# Patient Record
Sex: Male | Born: 1940 | Race: Black or African American | Hispanic: No | Marital: Married | Smoking: Never smoker
Health system: Southern US, Community
[De-identification: ages and names within clinical notes are randomized; demographics above are authoritative.]

## PROBLEM LIST (undated history)

## (undated) DIAGNOSIS — I10 Essential (primary) hypertension: Secondary | ICD-10-CM

## (undated) DIAGNOSIS — F0390 Unspecified dementia without behavioral disturbance: Secondary | ICD-10-CM

## (undated) HISTORY — PX: MOUTH SURGERY: SHX715

---

## 2010-10-10 ENCOUNTER — Emergency Department: Payer: Self-pay | Admitting: Emergency Medicine

## 2012-07-11 ENCOUNTER — Emergency Department: Payer: Self-pay | Admitting: Emergency Medicine

## 2012-07-11 LAB — URINALYSIS, COMPLETE
Bilirubin,UR: NEGATIVE
Blood: NEGATIVE
Glucose,UR: NEGATIVE mg/dL (ref 0–75)
Nitrite: NEGATIVE
Ph: 5 (ref 4.5–8.0)
Protein: 30
RBC,UR: 1 /HPF (ref 0–5)
Specific Gravity: 1.026 (ref 1.003–1.030)
Squamous Epithelial: <1
WBC UR: 2 /HPF (ref 0–5)

## 2012-07-11 LAB — COMPREHENSIVE METABOLIC PANEL
Albumin: 3.5 g/dL (ref 3.4–5.0)
Alkaline Phosphatase: 84 U/L (ref 50–136)
Anion Gap: 6 — ABNORMAL LOW (ref 7–16)
BUN: 7 mg/dL (ref 7–18)
Calcium, Total: 8.9 mg/dL (ref 8.5–10.1)
Chloride: 106 mmol/L (ref 98–107)
Creatinine: 1.25 mg/dL (ref 0.60–1.30)
EGFR (African American): >60
Potassium: 3.6 mmol/L (ref 3.5–5.1)
SGOT(AST): 20 U/L (ref 15–37)
SGPT (ALT): 19 U/L (ref 12–78)
Sodium: 141 mmol/L (ref 136–145)
Total Protein: 8.1 g/dL (ref 6.4–8.2)

## 2012-07-11 LAB — CBC
HGB: 13.9 g/dL (ref 13.0–18.0)
MCHC: 33.7 g/dL (ref 32.0–36.0)
Platelet: 256 10*3/uL (ref 150–440)
RDW: 14.3 % (ref 11.5–14.5)
WBC: 7.2 10*3/uL (ref 3.8–10.6)

## 2012-07-11 LAB — CK TOTAL AND CKMB (NOT AT ARMC): CK, Total: 68 U/L (ref 35–232)

## 2012-07-25 ENCOUNTER — Encounter (HOSPITAL_COMMUNITY): Payer: Self-pay | Admitting: Emergency Medicine

## 2012-07-25 ENCOUNTER — Emergency Department (HOSPITAL_COMMUNITY): Payer: Medicare Other

## 2012-07-25 ENCOUNTER — Observation Stay (HOSPITAL_COMMUNITY)
Admission: EM | Admit: 2012-07-25 | Discharge: 2012-07-27 | Disposition: A | Discharge status: Home / Self Care | Payer: Medicare Other | Attending: Internal Medicine | Admitting: Internal Medicine

## 2012-07-25 DIAGNOSIS — F0390 Unspecified dementia without behavioral disturbance: Secondary | ICD-10-CM

## 2012-07-25 DIAGNOSIS — E0590 Thyrotoxicosis, unspecified without thyrotoxic crisis or storm: Secondary | ICD-10-CM

## 2012-07-25 DIAGNOSIS — I10 Essential (primary) hypertension: Secondary | ICD-10-CM

## 2012-07-25 DIAGNOSIS — R259 Unspecified abnormal involuntary movements: Secondary | ICD-10-CM | POA: Insufficient documentation

## 2012-07-25 DIAGNOSIS — R55 Syncope and collapse: Principal | ICD-10-CM

## 2012-07-25 DIAGNOSIS — Z79899 Other long term (current) drug therapy: Secondary | ICD-10-CM | POA: Insufficient documentation

## 2012-07-25 HISTORY — DX: Unspecified dementia, unspecified severity, without behavioral disturbance, psychotic disturbance, mood disturbance, and anxiety: F03.90

## 2012-07-25 HISTORY — DX: Essential (primary) hypertension: I10

## 2012-07-25 LAB — URINALYSIS, ROUTINE W REFLEX MICROSCOPIC
Glucose, UA: NEGATIVE mg/dL
Ketones, ur: 15 mg/dL — AB
Protein, ur: NEGATIVE mg/dL
pH: 6 (ref 5.0–8.0)

## 2012-07-25 LAB — COMPREHENSIVE METABOLIC PANEL
AST: 15 U/L (ref 0–37)
BUN: 10 mg/dL (ref 6–23)
CO2: 27 mEq/L (ref 19–32)
Chloride: 106 mEq/L (ref 96–112)
Creatinine, Ser: 1.21 mg/dL (ref 0.50–1.35)
GFR calc Af Amer: 68 mL/min — ABNORMAL LOW (ref >90)
GFR calc non Af Amer: 58 mL/min — ABNORMAL LOW (ref >90)
Glucose, Bld: 121 mg/dL — ABNORMAL HIGH (ref 70–99)
Total Bilirubin: 0.4 mg/dL (ref 0.3–1.2)
Total Protein: 7.5 g/dL (ref 6.0–8.3)

## 2012-07-25 LAB — CBC
HCT: 38.5 % — ABNORMAL LOW (ref 39.0–52.0)
Hemoglobin: 13.3 g/dL (ref 13.0–17.0)
MCV: 90.6 fL (ref 78.0–100.0)
Platelets: 226 10*3/uL (ref 150–400)
RBC: 4.25 MIL/uL (ref 4.22–5.81)
WBC: 6.9 10*3/uL (ref 4.0–10.5)

## 2012-07-25 LAB — URINE MICROSCOPIC-ADD ON

## 2012-07-25 LAB — CBC WITH DIFFERENTIAL/PLATELET
Hemoglobin: 13.2 g/dL (ref 13.0–17.0)
Lymphocytes Relative: 6 % — ABNORMAL LOW (ref 12–46)
Lymphs Abs: 0.4 10*3/uL — ABNORMAL LOW (ref 0.7–4.0)
Neutro Abs: 5.6 10*3/uL (ref 1.7–7.7)
Neutrophils Relative %: 88 % — ABNORMAL HIGH (ref 43–77)
Platelets: 235 10*3/uL (ref 150–400)
RBC: 4.22 MIL/uL (ref 4.22–5.81)
WBC: 6.3 10*3/uL (ref 4.0–10.5)

## 2012-07-25 NOTE — ED Notes (Addendum)
Daughter--SHONTELL -- [phone] Wife -[facility]  -- [phone]    Please notify of room assignment upon admission.

## 2012-07-25 NOTE — ED Notes (Signed)
Patient transported to CT 

## 2012-07-25 NOTE — H&P (Signed)
[facility] History and Physical  Joshua Farrell GNF:[id number] DOB: 04-29-1941 DOA: 07/25/2012  Referring physician: Dr. Karma Ganja. PCP: Default, Provider, MD  Specialists: None.  History obtained from patient's wife, nurse and ER physician. As patient is demented.  Chief Complaint: Loss of consciousness.  HPI: Joshua Farrell is a 72 y.o. male with history of hypertension, dementia had 2 episodes of loss of consciousness today at his daughter's house. As per patient's wife who was not at the site when the incident happened patient briefly lost consciousness when he tried to walk from the dining hall to the room. Once after the first spell he stood up and again had a second episode within a minute. He did not have any associated seizure like activity loss of function of extremities headache nausea vomiting diarrhea. Patient did not complain of any palpitations chest pain shortness of breath. In the ER patient was initially found to be orthostatic. EKG and CT head were unremarkable. Patient at this time has been admitted for further observation. Patient recently had tooth extraction 2 weeks ago for denture placement. Recently last few months patient has been having increasing tremors and parkinsonian features which patient's wife states that patient's PCP thinks could be from Risperdal.  Review of Systems: As presented in the history of present illness nothing else significant.  Past Medical History  Diagnosis Date  . Hypertension   . Dementia    Past Surgical History  Procedure Laterality Date  . Mouth surgery     Social History:  reports that he has never smoked. He does not have any smokeless tobacco history on file. He reports that he does not drink alcohol or use illicit drugs. Patient lives at home with his wife. where does patient live--home, ALF, SNF? and with whom if at home? Not sure if patient can do ADLs. Can patient participate in ADLs?  No Known Allergies  Family history :  Positive for dementia in patient's mother.  Prior to Admission medications   Medication Sig Start Date End Date Taking? Authorizing Provider  amLODipine (NORVASC) 5 MG tablet Take 5 mg by mouth daily.   Yes Historical Provider, MD  Cholecalciferol (VITAMIN D) 2000 UNITS CAPS Take 1 capsule by mouth daily.   Yes Historical Provider, MD  donepezil (ARICEPT) 10 MG tablet Take 20 mg by mouth every morning.   Yes Historical Provider, MD  ramelteon (ROZEREM) 8 MG tablet Take 16 mg by mouth at bedtime.   Yes Historical Provider, MD  risperiDONE (RISPERDAL) 2 MG tablet Take 2 mg by mouth every morning.   Yes Historical Provider, MD   Physical Exam: Filed Vitals:   07/25/12 1743 07/25/12 1917 07/25/12 1918 07/25/12 1919  BP: 117/73 118/70 127/87 142/88  Pulse: 90 73 73 80  Temp:      TempSrc:      Resp:      SpO2:         General:  Well-developed and nourished.  Eyes: Anicteric no pallor.  ENT: No discharge from ears eyes nose or mouth.  Neck: No mass felt.  Cardiovascular: S1-S2 heard.  Respiratory: No rhonchi no crepitations.  Abdomen: Soft nontender bowel sounds present.  Skin: No rash.  Musculoskeletal: Nontender no effusions. No edema.  Psychiatric: Patient is demented and oriented to his name only.  Neurologic: Moves all extremities 5 x 5.  Labs on Admission:  Basic Metabolic Panel:  Recent Labs Lab 07/25/12 1653  NA 141  K 4.1  CL 106  CO2 27  GLUCOSE  121*  BUN 10  CREATININE 1.21  CALCIUM 9.4   Liver Function Tests:  Recent Labs Lab 07/25/12 1653  AST 15  ALT 13  ALKPHOS 68  BILITOT 0.4  PROT 7.5  ALBUMIN 3.4*   No results found for this basename: LIPASE, AMYLASE,  in the last 168 hours No results found for this basename: AMMONIA,  in the last 168 hours CBC:  Recent Labs Lab 07/25/12 1451 07/25/12 1653  WBC 6.3 6.9  NEUTROABS 5.6  --   HGB 13.2 13.3  HCT 38.1* 38.5*  MCV 90.3 90.6  PLT 235 226   Cardiac Enzymes:  Recent Labs Lab  07/25/12 1654  TROPONINI <0.30    BNP (last 3 results) No results found for this basename: PROBNP,  in the last 8760 hours CBG: No results found for this basename: GLUCAP,  in the last 168 hours  Radiological Exams on Admission: Dg Chest 2 View  07/25/2012  *RADIOLOGY REPORT*  Clinical Data: Loss of consciousness  CHEST - 2 VIEW  Comparison: None.  Findings: Lungs are essentially clear.  No focal consolidation.  No pleural effusion or pneumothorax.  Cardiomediastinal silhouette is within Joshua limits.  Mild degenerative changes of the visualized thoracolumbar spine.  IMPRESSION: No evidence of acute cardiopulmonary disease.   Original Report Authenticated By: Charline Bills, M.D.    Ct Head Wo Contrast  07/25/2012  *RADIOLOGY REPORT*  Clinical Data: Syncope, slurred speech  CT HEAD WITHOUT CONTRAST  Technique:  Contiguous axial images were obtained from the base of the skull through the vertex without contrast.  Comparison: None.  Findings: No acute intracranial hemorrhage.  No focal mass lesion. No CT evidence of acute infarction.   No midline shift or mass effect.  No hydrocephalus.  Basilar cisterns are patent.  There is generalized cortical atrophy.  There are periventricular white matter hypodensities.  There is mild ventricular dilatation.  Paranasal sinuses and mastoid air cells are clear.  Orbits are Joshua.  IMPRESSION:  1.  No acute intracranial findings. 2.  Atrophy and concordant mild ventricular dilatation 3.  Mild microvascular disease   Original Report Authenticated By: Genevive Bi, M.D.     EKG: Independently reviewed. Sinus rhythm.  Assessment/Plan Principal Problem:   Syncope Active Problems:   Hypertension   Dementia   1. Syncope - given patient's orthostatic changes initially at this time patient has been placed on gentle hydration and recheck orthostatics in a.m. and possible cause could be mild dehydration. Presently patient is asymptomatic on  lying. 2. Hypertension - continue present medications. 3. Dementia - continue present medications. 4.   Tremor - possibly medication induced. PCP is working on this as per patient's family.   Code Status: Full code as confirmed the patient's wife. Family Communication: Discussed with patient's wife.  Disposition Plan: Admit for observation.   KAKRAKANDY,ARSHAD N. [facility] Pager [phone].  If 7PM-7AM, please contact night-coverage [email or web address] Password [facility] 07/25/2012, 9:31 PM

## 2012-07-25 NOTE — ED Provider Notes (Addendum)
History     CSN: [id number]  Arrival date & time 07/25/12  1438   First MD Initiated Contact with Patient 07/25/12 1650      Chief Complaint  Patient presents with  . Loss of Consciousness    (Consider location/radiation/quality/duration/timing/severity/associated sxs/prior treatment) HPI A LEVEL 5 CAVEAT PERTAINS DUE TO DEMENTIA Pt presents with c/o 2 syncopal episodes today.  Per family he was walking in the house and passed out briefly when walking into the kitchen and then a second time when walking into the bathroom.  Pt denies feeling any symptoms associated with the syncope- no chest pain or headache.  Family notes he has had a tremor of his upper extremities for months and this is unchanged.  Pt denies any pain from falls today.    Past Medical History  Diagnosis Date  . Hypertension   . Dementia     Past Surgical History  Procedure Laterality Date  . Mouth surgery      History reviewed. No pertinent family history.  History  Substance Use Topics  . Smoking status: Never Smoker   . Smokeless tobacco: Not on file  . Alcohol Use: No      Review of Systems UNABLE TO OBTAIN ROS DUE TO LEVEL 5 CAVEAT  Allergies  Review of patient's allergies indicates no known allergies.  Home Medications   Current Outpatient Rx  Name  Route  Sig  Dispense  Refill  . amLODipine (NORVASC) 5 MG tablet   Oral   Take 5 mg by mouth daily.         . Cholecalciferol (VITAMIN D) 2000 UNITS CAPS   Oral   Take 1 capsule by mouth daily.         Marland Kitchen donepezil (ARICEPT) 10 MG tablet   Oral   Take 20 mg by mouth every morning.         . ramelteon (ROZEREM) 8 MG tablet   Oral   Take 16 mg by mouth at bedtime.         . risperiDONE (RISPERDAL) 2 MG tablet   Oral   Take 2 mg by mouth every morning.           BP 142/88  Pulse 80  Temp(Src) 98.3 F (36.8 C) (Oral)  Resp 16  SpO2 100% Vitals reviewed Physical Exam Physical Examination: General appearance - alert,  well appearing, and in no distress Mental status - alert, oriented to person, and place, not to time Eyes - pupils equal and reactive, extraocular eye movements intact Head- NCAT Mouth - mucous membranes moist, pharynx normal without lesions Chest - clear to auscultation, no wheezes, rales or rhonchi, symmetric air entry Heart - normal rate, regular rhythm, normal S1, S2, no murmurs, rubs, clicks or gallops Abdomen - soft, nontender, nondistended, no masses or organomegaly Back- nontender in midline spine, no CVA tenderness Neurological - alert, oriented, normal speech, no focal findings or movement disorder noted Musculoskeletal - FROM of extremities x 4 without pain, no joint tenderness, deformity or swelling Extremities - peripheral pulses normal, no pedal edema, no clubbing or cyanosis Skin - normal coloration and turgor, no rashes  ED Course  Procedures (including critical care time)  8:23 PM d/w [facility]- pt to be admitted telemetry, team 10, Dr. Toniann Fail   Date: 07/25/2012  Rate: 83  Rhythm: normal sinus rhythm  QRS Axis: left  Intervals: normal  ST/T Wave abnormalities: normal  Conduction Disutrbances:none  Narrative Interpretation:   Old EKG Reviewed: none  available   Labs Reviewed  CBC WITH DIFFERENTIAL - Abnormal; Notable for the following:    HCT 38.1 (*)    Neutrophils Relative 88 (*)    Lymphocytes Relative 6 (*)    Lymphs Abs 0.4 (*)    All other components within normal limits  CBC - Abnormal; Notable for the following:    HCT 38.5 (*)    All other components within normal limits  COMPREHENSIVE METABOLIC PANEL - Abnormal; Notable for the following:    Glucose, Bld 121 (*)    Albumin 3.4 (*)    GFR calc non Af Amer 58 (*)    GFR calc Af Amer 68 (*)    All other components within normal limits  URINALYSIS, ROUTINE W REFLEX MICROSCOPIC - Abnormal; Notable for the following:    Color, Urine AMBER (*)    APPearance CLOUDY (*)    Bilirubin Urine  SMALL (*)    Ketones, ur 15 (*)    Leukocytes, UA SMALL (*)    All other components within normal limits  URINE MICROSCOPIC-ADD ON - Abnormal; Notable for the following:    Casts HYALINE CASTS (*)    Crystals CA OXALATE CRYSTALS (*)    All other components within normal limits  URINE CULTURE  TROPONIN I   Dg Chest 2 View  07/25/2012  *RADIOLOGY REPORT*  Clinical Data: Loss of consciousness  CHEST - 2 VIEW  Comparison: None.  Findings: Lungs are essentially clear.  No focal consolidation.  No pleural effusion or pneumothorax.  Cardiomediastinal silhouette is within normal limits.  Mild degenerative changes of the visualized thoracolumbar spine.  IMPRESSION: No evidence of acute cardiopulmonary disease.   Original Report Authenticated By: Charline Bills, M.D.    Ct Head Wo Contrast  07/25/2012  *RADIOLOGY REPORT*  Clinical Data: Syncope, slurred speech  CT HEAD WITHOUT CONTRAST  Technique:  Contiguous axial images were obtained from the base of the skull through the vertex without contrast.  Comparison: None.  Findings: No acute intracranial hemorrhage.  No focal mass lesion. No CT evidence of acute infarction.   No midline shift or mass effect.  No hydrocephalus.  Basilar cisterns are patent.  There is generalized cortical atrophy.  There are periventricular white matter hypodensities.  There is mild ventricular dilatation.  Paranasal sinuses and mastoid air cells are clear.  Orbits are normal.  IMPRESSION:  1.  No acute intracranial findings. 2.  Atrophy and concordant mild ventricular dilatation 3.  Mild microvascular disease   Original Report Authenticated By: Genevive Bi, M.D.      1. Syncope   2. Dementia   3. Hypertension       MDM  Pt presenting with 2 syncopal episodes today.  ekg unremarkable as well as hemoglobin and other labs are reassuring.  Pt has no evidence of trauma on exam.  D/w [facility] for admission for syncope evaluation.  Discharged with strict return precautions.  Pt  agreeable with plan.        Ethelda Chick, MD 07/25/12 2245  Ethelda Chick, MD 07/25/12 [phone]

## 2012-07-25 NOTE — ED Notes (Signed)
Family reports pt passed out twice today first time at 76 and 1343. Pt has been shaking for months. Family reports slurred speech also.

## 2012-07-25 NOTE — ED Notes (Signed)
Report received from Chris, RN

## 2012-07-25 NOTE — ED Notes (Signed)
Family at bedside. 

## 2012-07-26 LAB — BASIC METABOLIC PANEL
BUN: 7 mg/dL (ref 6–23)
Calcium: 9.2 mg/dL (ref 8.4–10.5)
GFR calc non Af Amer: 81 mL/min — ABNORMAL LOW (ref >90)
Glucose, Bld: 86 mg/dL (ref 70–99)
Sodium: 143 mEq/L (ref 135–145)

## 2012-07-26 LAB — CBC WITH DIFFERENTIAL/PLATELET
Basophils Absolute: 0 10*3/uL (ref 0.0–0.1)
Eosinophils Absolute: 0.2 10*3/uL (ref 0.0–0.7)
Eosinophils Relative: 3 % (ref 0–5)
MCH: 31 pg (ref 26.0–34.0)
MCV: 91.1 fL (ref 78.0–100.0)
Platelets: 205 10*3/uL (ref 150–400)
RDW: 13.9 % (ref 11.5–15.5)

## 2012-07-26 LAB — T4, FREE: Free T4: 1.03 ng/dL (ref 0.80–1.80)

## 2012-07-26 MED ORDER — ACETAMINOPHEN 650 MG RE SUPP: 650.0000 mg | Freq: Four times a day (QID) | RECTAL | Status: DC | PRN

## 2012-07-26 MED ORDER — ONDANSETRON HCL 4 MG PO TABS: 4.0000 mg | ORAL_TABLET | Freq: Four times a day (QID) | ORAL | Status: DC | PRN

## 2012-07-26 MED ORDER — SODIUM CHLORIDE 0.9 % IJ SOLN
3.0000 mL | Freq: Two times a day (BID) | INTRAMUSCULAR | Status: DC
  Administered 2012-07-26 (×2): 3 mL via INTRAVENOUS

## 2012-07-26 MED ORDER — ACETAMINOPHEN 325 MG PO TABS: 650.0000 mg | ORAL_TABLET | Freq: Four times a day (QID) | ORAL | Status: DC | PRN

## 2012-07-26 MED ORDER — ENOXAPARIN SODIUM 40 MG/0.4ML ~~LOC~~ SOLN
40.0000 mg | SUBCUTANEOUS | Status: DC
  Administered 2012-07-26: 40 mg via SUBCUTANEOUS
  Filled 2012-07-26 (×2): qty 0.4

## 2012-07-26 MED ORDER — RISPERIDONE 2 MG PO TABS
2.0000 mg | ORAL_TABLET | Freq: Every morning | ORAL | Status: DC
  Administered 2012-07-26 – 2012-07-27 (×2): 2 mg via ORAL
  Filled 2012-07-26 (×2): qty 1

## 2012-07-26 MED ORDER — AMLODIPINE BESYLATE 5 MG PO TABS
5.0000 mg | ORAL_TABLET | Freq: Every day | ORAL | Status: DC
  Administered 2012-07-26 – 2012-07-27 (×2): 5 mg via ORAL
  Filled 2012-07-26 (×2): qty 1

## 2012-07-26 MED ORDER — ONDANSETRON HCL 4 MG/2ML IJ SOLN: 4.0000 mg | Freq: Four times a day (QID) | INTRAMUSCULAR | Status: DC | PRN

## 2012-07-26 MED ORDER — SODIUM CHLORIDE 0.9 % IV SOLN
INTRAVENOUS | Status: AC
  Administered 2012-07-26 (×2): via INTRAVENOUS

## 2012-07-26 MED ORDER — RAMELTEON 8 MG PO TABS
16.0000 mg | ORAL_TABLET | Freq: Every day | ORAL | Status: DC
  Administered 2012-07-26: 16 mg via ORAL
  Filled 2012-07-26 (×3): qty 2

## 2012-07-26 MED ORDER — DONEPEZIL HCL 10 MG PO TABS
20.0000 mg | ORAL_TABLET | Freq: Every morning | ORAL | Status: DC
  Administered 2012-07-26 – 2012-07-27 (×2): 20 mg via ORAL
  Filled 2012-07-26 (×2): qty 2

## 2012-07-26 NOTE — Progress Notes (Signed)
[facility] PROGRESS NOTE  Joshua Farrell ZOX:[id number] DOB: 06-13-1940 DOA: 07/25/2012 PCP: Default, Provider, MD  Brief Narrative: Joshua Farrell is a 72 y.o. male with history of hypertension, dementia had 2 episodes of loss of consciousness today at his daughter's house. As per patient's wife who was not at the site when the incident happened patient briefly lost consciousness when he tried to walk from the dining hall to the room. Once after the first spell he stood up and again had a second episode within a minute. He did not have any associated seizure like activity loss of function of extremities headache nausea vomiting diarrhea. Patient did not complain of any palpitations chest pain shortness of breath. In the ER patient was initially found to be orthostatic. EKG and CT head were unremarkable. Patient at this time has been admitted for further observation. Patient recently had tooth extraction 2 weeks ago for denture placement. Recently last few months patient has been having increasing tremors and parkinsonian features which patient's wife states that patient's PCP thinks could be from Risperdal.  Assessment/Plan: 1. Syncope - given patient's orthostatic changes initially at this time patient has been placed on gentle hydration. Patient orthostasis has improved. 2. High TSH - hyperthyroidism vs subclinical hyperthyroidism. Heart rate normal and tremor not consistent with tremor generally associated with hyperthyroidism. Will check T3, T4 and cortisol. Will contact PCP to follow up.  3. Hypertension - continue present medications. 4. Dementia - continue present medications. 5. Tremor - possibly medication induced. PCP is working on this as per patient's family.   Code Status: Full Family Communication: wife, daughter  Disposition Plan: home tomorrow.   Consultants:  none  Procedures:  none  Antibiotics:  none  HPI/Subjective: Denies complaints today.   Objective: Filed  Vitals:   07/26/12 1250 07/26/12 1251 07/26/12 1252 07/26/12 1255  BP: 121/71 128/75 125/71 129/75  Pulse: 64 65 72 74  Temp: 97.2 F (36.2 C)     TempSrc: Oral     Resp: 18     Height:      Weight:      SpO2: 100%       Intake/Output Summary (Last 24 hours) at 07/26/12 1654 Last data filed at 07/26/12 0800  Gross per 24 hour  Intake    240 ml  Output    200 ml  Net     40 ml   Filed Weights   07/25/12 2325 07/26/12 0500  Weight: 53.661 kg (118 lb 4.8 oz) 53.661 kg (118 lb 4.8 oz)    Exam:   General:  NAD  Cardiovascular: regular rate and rhythm, without MRG  Respiratory: good air movement, clear to auscultation throughout, no wheezing, ronchi or rales  Abdomen: soft, not tender to palpation, positive bowel sounds  MSK: no peripheral edema  Neuro: non focal, mild tremor  Data Reviewed: Basic Metabolic Panel:  Recent Labs Lab 07/25/12 1653 07/26/12 0712  NA 141 143  K 4.1 3.7  CL 106 108  CO2 27 29  GLUCOSE 121* 86  BUN 10 7  CREATININE 1.21 0.97  CALCIUM 9.4 9.2   Liver Function Tests:  Recent Labs Lab 07/25/12 1653  AST 15  ALT 13  ALKPHOS 68  BILITOT 0.4  PROT 7.5  ALBUMIN 3.4*   No results found for this basename: LIPASE, AMYLASE,  in the last 168 hours No results found for this basename: AMMONIA,  in the last 168 hours CBC:  Recent Labs Lab 07/25/12 1451 07/25/12 1653 07/26/12  0712  WBC 6.3 6.9 5.0  NEUTROABS 5.6  --  3.1  HGB 13.2 13.3 12.9*  HCT 38.1* 38.5* 37.9*  MCV 90.3 90.6 91.1  PLT 235 226 205   Cardiac Enzymes:  Recent Labs Lab 07/25/12 1654  TROPONINI <0.30   BNP (last 3 results) No results found for this basename: PROBNP,  in the last 8760 hours CBG: No results found for this basename: GLUCAP,  in the last 168 hours  No results found for this or any previous visit (from the past 240 hour(s)).   Studies: Dg Chest 2 View  07/25/2012  *RADIOLOGY REPORT*  Clinical Data: Loss of consciousness  CHEST - 2 VIEW   Comparison: None.  Findings: Lungs are essentially clear.  No focal consolidation.  No pleural effusion or pneumothorax.  Cardiomediastinal silhouette is within normal limits.  Mild degenerative changes of the visualized thoracolumbar spine.  IMPRESSION: No evidence of acute cardiopulmonary disease.   Original Report Authenticated By: Charline Bills, M.D.    Ct Head Wo Contrast  07/25/2012  *RADIOLOGY REPORT*  Clinical Data: Syncope, slurred speech  CT HEAD WITHOUT CONTRAST  Technique:  Contiguous axial images were obtained from the base of the skull through the vertex without contrast.  Comparison: None.  Findings: No acute intracranial hemorrhage.  No focal mass lesion. No CT evidence of acute infarction.   No midline shift or mass effect.  No hydrocephalus.  Basilar cisterns are patent.  There is generalized cortical atrophy.  There are periventricular white matter hypodensities.  There is mild ventricular dilatation.  Paranasal sinuses and mastoid air cells are clear.  Orbits are normal.  IMPRESSION:  1.  No acute intracranial findings. 2.  Atrophy and concordant mild ventricular dilatation 3.  Mild microvascular disease   Original Report Authenticated By: Genevive Bi, M.D.     Scheduled Meds: . amLODipine  5 mg Oral Daily  . donepezil  20 mg Oral q morning - 10a  . enoxaparin (LOVENOX) injection  40 mg Subcutaneous Q24H  . ramelteon  16 mg Oral QHS  . risperiDONE  2 mg Oral q morning - 10a  . sodium chloride  3 mL Intravenous Q12H   Continuous Infusions:   Principal Problem:   Syncope Active Problems:   Hypertension   Dementia   [name], MD [facility] Pager [phone]. If 7 PM - 7 AM, please contact night-coverage at [email or web address], password [facility] 07/26/2012, 4:54 PM  LOS: 1 day

## 2012-07-26 NOTE — Evaluation (Signed)
Physical Therapy Evaluation Patient Details Name: Joshua Farrell MRN: [id number] DOB: 11/30/1940 Today's Date: 07/26/2012 Time: 1420-1440 PT Time Calculation (min): 20 min  PT Assessment / Plan / Recommendation Clinical Impression  72 yo male admitted with syncope, fall. Family present during eval. On eval pt was supervision level assist for all mobility. No LOB with tasks. Family is available 24/7. Feel pt is safe to d/c back home with family supervision. No follow-up PT needed acutely or after d/c. Recommend daily mobility with nursing/family during stay. 1x eval.     PT Assessment  Patent does not need any further PT services    Follow Up Recommendations  No PT follow up    Does the patient have the potential to tolerate intense rehabilitation      Barriers to Discharge        Equipment Recommendations  None recommended by PT    Recommendations for Other Services     Frequency      Precautions / Restrictions Precautions Precautions: None Restrictions Weight Bearing Restrictions: No   Pertinent Vitals/Pain No c/o pain      Mobility  Bed Mobility Bed Mobility: Sit to Supine Sit to Supine: 5: Supervision Transfers Transfers: Sit to Stand;Stand to Sit Sit to Stand: From bed;6: Modified independent (Device/Increase time) Stand to Sit: To bed;6: Modified independent (Device/Increase time) Ambulation/Gait Ambulation/Gait Assistance: 5: Supervision Ambulation Distance (Feet): 200 Feet Assistive device: None Ambulation/Gait Assistance Details: Good gait speed. No LOB Gait Pattern: Step-through pattern Stairs: Yes Stairs Assistance: 5: Supervision Stairs Assistance Details (indicate cue type and reason): VCS safety Stair Management Technique: Alternating pattern;No rails Number of Stairs: 4    Exercises     PT Diagnosis:    PT Problem List:   PT Treatment Interventions:     PT Goals    Visit Information  Last PT Received On: 07/26/12 Assistance Needed: +1     Subjective Data  Subjective: Im alright Patient Stated Goal: none stated   Prior Functioning  Home Living Lives With: Spouse;Family Available Help at Discharge: Available 24 hours/day Type of Home: House Home Access: Stairs to enter [employer] of Steps: 5 Entrance Stairs-Rails: None Home Layout: One level Home Adaptive Equipment: None Prior Function Level of Independence: Needs assistance Able to Take Stairs?: Yes Driving: No Comments: Pt is supervision level assist. Communication Communication: No difficulties    Cognition  Cognition Overall Cognitive Status: History of cognitive impairments - at baseline Arousal/Alertness: Awake/alert Orientation Level: Time Behavior During Session: [facility] for tasks performed    Extremity/Trunk Assessment Right Lower Extremity Assessment RLE ROM/Strength/Tone: [facility] for tasks assessed Left Lower Extremity Assessment LLE ROM/Strength/Tone: [facility] for tasks assessed Trunk Assessment Trunk Assessment: Normal   Balance Balance Balance Assessed: Yes Static Standing Balance Static Standing - Level of Assistance: 6: Modified independent (Device/Increase time) Static Standing - Comment/# of Minutes: Had pt perform EO/EC, narrow BOS - no LOB/difficulty Single Leg Stance - Right Leg: 4 (LOB after 4 seconds. Good use of step strategy) Dynamic Standing Balance Dynamic Standing - Balance Support: No upper extremity supported Dynamic Standing - Level of Assistance: 6: Modified independent (Device/Increase time) Dynamic Standing - Comments: 360 degree turn, p/u object-no LOB difficulty. Increased time to complete turn.  High Level Balance High Level Balance Activites: Direction changes;Turns  End of Session PT - End of Session Equipment Utilized During Treatment: Gait belt Activity Tolerance: Patient tolerated treatment well Patient left: in bed;with call bell/phone within reach;with family/visitor present  GP Functional Assessment Tool  Used:  clinical judgement Functional Limitation: Mobility: Walking and moving around Mobility: Walking and Moving Around Current Status [phone]): At least 1 percent but less than 20 percent impaired, limited or restricted Mobility: Walking and Moving Around Goal Status [phone]): At least 1 percent but less than 20 percent impaired, limited or restricted Mobility: Walking and Moving Around Discharge Status [phone]): At least 1 percent but less than 20 percent impaired, limited or restricted   Rebeca Alert [facility] 07/26/2012, 2:46 PM [phone]

## 2012-07-27 LAB — URINE CULTURE

## 2012-07-27 NOTE — Discharge Instructions (Signed)
Please follow up with your PCP in 1-2 weeks and take these instructions with you.  Your TSH   Lab Results  Component Value Date   TSH 0.215* 07/26/2012   was found to be mildly lower than normal which might suggest hyperthyroidism. 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 This condition is unlikely to give you any symptoms and generally does not require treatment but it needs to be followed up.   Syncope Syncope means a person passes out (faints). The person usually wakes up in less than 5 minutes. It is important to seek medical care for syncope. HOME CARE  Have someone stay with you until you feel normal.  Do not drive, use machines, or play sports until your doctor says it is okay.  Keep all doctor visits as told.  Lie down when you feel like you might pass out. Take deep breaths. Wait until you feel normal before standing up.  Drink enough fluids to keep your pee (urine) clear or pale yellow.  If you take blood pressure or heart medicine, get up slowly. Take several minutes to sit and then stand. GET HELP RIGHT AWAY IF:   You have a severe headache.  You have pain in the chest, belly (abdomen), or back.  You are bleeding from the mouth or butt (rectum).  You have black or tarry poop (stool).  You have an irregular or very fast heartbeat.  You have pain with breathing.  You keep passing out, or you have shaking (seizures) when you pass out.  You pass out when sitting or lying down.  You feel confused.  You have trouble walking.  You have severe weakness.  You have vision problems. If you fainted, call your local emergency services (911 in [place] Do not drive yourself to the hospital. MAKE SURE YOU:   Understand these instructions.  Will watch your condition.  Will get help right away if you are not doing well or get worse. Document Released: 10/29/2007 Document Revised: 11/11/2011 Document  Reviewed: 07/11/2011 [facility] Patient Information 2013 [place], [place].

## 2012-07-27 NOTE — Discharge Summary (Signed)
Physician Discharge Summary  Joshua Farrell ZOX:[id number] DOB: 1940-09-09 DOA: 07/25/2012  PCP: Default, Provider, MD  Admit date: 07/25/2012 Discharge date: 07/27/2012  Time spent: 35 minutes  Recommendations for Outpatient Follow-up:  1. Please followup with her primary care doctor in one to 2 weeks  Discharge Diagnoses:  Principal Problem:   Syncope Active Problems:   Hypertension   Dementia  Discharge Condition: Stable  Diet recommendation: Heart healthy  Filed Weights   07/25/12 2325 07/26/12 0500 07/27/12 0542  Weight: 53.661 kg (118 lb 4.8 oz) 53.661 kg (118 lb 4.8 oz) 54.7 kg (120 lb 9.5 oz)    History of present illness:  Joshua Farrell is a 72 y.o. male with history of hypertension, dementia had 2 episodes of loss of consciousness today at his daughter's house. As per patient's wife who was not at the site when the incident happened patient briefly lost consciousness when he tried to walk from the dining hall to the room. Once after the first spell he stood up and again had a second episode within a minute. He did not have any associated seizure like activity loss of function of extremities headache nausea vomiting diarrhea. Patient did not complain of any palpitations chest pain shortness of breath. In the ER patient was initially found to be orthostatic. EKG and CT head were unremarkable. Patient at this time has been admitted for further observation. Patient recently had tooth extraction 2 weeks ago for denture placement. Recently last few months patient has been having increasing tremors and parkinsonian features which patient's wife states that patient's PCP thinks could be from Risperdal.  Hospital Course:  1. Syncope - given patient's orthostatic changes initially at this time patient has been placed initially on gentle hydration. Repeat orthostatic vital signs showed improvement of the hydration, and patient was asymptomatic. 2. High TSH - patient was found to have a mildly  decreased TSH at 0.215 with normal free T3 and T4. This is likely subclinical hyperthyroidism as patient does not have any symptoms consistent with hyperthyroidism. 3. Hypertension - his home medications were continued throughout his hospital stay, and no changes were made on discharge. 4. Dementia - his home medications were continued throughout his hospital stay, and no changes were made on discharge. 5. Tremor - possibly medication induced. PCP is working this as an outpatient  Procedures:  None (i.e. Studies not automatically included, echos, thoracentesis, etc; not x-rays)  Consultations:  None  Discharge Exam: Filed Vitals:   07/26/12 1252 07/26/12 1255 07/26/12 2100 07/27/12 0542  BP: 125/71 129/75 125/71 135/74  Pulse: 72 74 59 56  Temp:   97.9 F (36.6 C) 98.5 F (36.9 C)  TempSrc:   Oral Oral  Resp:   18 18  Height:      Weight:    54.7 kg (120 lb 9.5 oz)  SpO2:   100% 100%    General: No acute distress Cardiovascular: Regular rate and rhythm, without murmurs Respiratory: Clear to auscultation bilaterally, no wheezing  Discharge Instructions    Medication List    TAKE these medications       amLODipine 5 MG tablet  Commonly known as:  NORVASC  Take 5 mg by mouth daily.     donepezil 10 MG tablet  Commonly known as:  ARICEPT  Take 20 mg by mouth every morning.     ramelteon 8 MG tablet  Commonly known as:  ROZEREM  Take 16 mg by mouth at bedtime.     risperiDONE 2 MG  tablet  Commonly known as:  RISPERDAL  Take 2 mg by mouth every morning.     Vitamin D 2000 UNITS Caps  Take 1 capsule by mouth daily.       The results of significant diagnostics from this hospitalization (including imaging, microbiology, ancillary and laboratory) are listed below for reference.    Significant Diagnostic Studies: Dg Chest 2 View  07/25/2012  *RADIOLOGY REPORT*  Clinical Data: Loss of consciousness  CHEST - 2 VIEW  Comparison: None.  Findings: Lungs are essentially  clear.  No focal consolidation.  No pleural effusion or pneumothorax.  Cardiomediastinal silhouette is within normal limits.  Mild degenerative changes of the visualized thoracolumbar spine.  IMPRESSION: No evidence of acute cardiopulmonary disease.   Original Report Authenticated By: Charline Bills, M.D.    Ct Head Wo Contrast  07/25/2012  *RADIOLOGY REPORT*  Clinical Data: Syncope, slurred speech  CT HEAD WITHOUT CONTRAST  Technique:  Contiguous axial images were obtained from the base of the skull through the vertex without contrast.  Comparison: None.  Findings: No acute intracranial hemorrhage.  No focal mass lesion. No CT evidence of acute infarction.   No midline shift or mass effect.  No hydrocephalus.  Basilar cisterns are patent.  There is generalized cortical atrophy.  There are periventricular white matter hypodensities.  There is mild ventricular dilatation.  Paranasal sinuses and mastoid air cells are clear.  Orbits are normal.  IMPRESSION:  1.  No acute intracranial findings. 2.  Atrophy and concordant mild ventricular dilatation 3.  Mild microvascular disease   Original Report Authenticated By: Genevive Bi, M.D.    Microbiology: Recent Results (from the past 240 hour(s))  URINE CULTURE     Status: None   Collection Time    07/25/12  7:00 PM      Result Value Range Status   Specimen Description URINE, CLEAN CATCH   Final   Special Requests ADDED [id number]   Final   Culture  Setup Time 07/25/2012 22:05   Final   Colony Count NO GROWTH   Final   Culture NO GROWTH   Final   Report Status 07/27/2012 FINAL   Final     Labs: Basic Metabolic Panel:  Recent Labs Lab 07/25/12 1653 07/26/12 0712  NA 141 143  K 4.1 3.7  CL 106 108  CO2 27 29  GLUCOSE 121* 86  BUN 10 7  CREATININE 1.21 0.97  CALCIUM 9.4 9.2   Liver Function Tests:  Recent Labs Lab 07/25/12 1653  AST 15  ALT 13  ALKPHOS 68  BILITOT 0.4  PROT 7.5  ALBUMIN 3.4*   CBC:  Recent Labs Lab 07/25/12 1451  07/25/12 1653 07/26/12 0712  WBC 6.3 6.9 5.0  NEUTROABS 5.6  --  3.1  HGB 13.2 13.3 12.9*  HCT 38.1* 38.5* 37.9*  MCV 90.3 90.6 91.1  PLT 235 226 205   Cardiac Enzymes:  Recent Labs Lab 07/25/12 1654  TROPONINI <0.30   Lab Results  Component Value Date   TSH 0.215* 07/26/2012   Signed:  [name]  [facility] 07/27/2012, 1:44 PM

## 2012-11-12 LAB — CBC
HCT: 39.5 % — ABNORMAL LOW (ref 40.0–52.0)
HGB: 13.3 g/dL (ref 13.0–18.0)
MCH: 30.1 pg (ref 26.0–34.0)
MCV: 90 fL (ref 80–100)
RBC: 4.42 10*6/uL (ref 4.40–5.90)
WBC: 4.4 10*3/uL (ref 3.8–10.6)

## 2012-11-12 LAB — COMPREHENSIVE METABOLIC PANEL
Albumin: 3.4 g/dL (ref 3.4–5.0)
Alkaline Phosphatase: 74 U/L (ref 50–136)
BUN: 7 mg/dL (ref 7–18)
Bilirubin,Total: 0.5 mg/dL (ref 0.2–1.0)
Calcium, Total: 8.9 mg/dL (ref 8.5–10.1)
Co2: 29 mmol/L (ref 21–32)
EGFR (African American): >60
EGFR (Non-African Amer.): >60
Glucose: 128 mg/dL — ABNORMAL HIGH (ref 65–99)
Osmolality: 283 (ref 275–301)
SGPT (ALT): 20 U/L (ref 12–78)

## 2012-11-13 ENCOUNTER — Observation Stay: Payer: Self-pay | Admitting: Internal Medicine

## 2012-11-13 LAB — CBC WITH DIFFERENTIAL/PLATELET
Eosinophil #: 0 10*3/uL (ref 0.0–0.7)
HGB: 12.9 g/dL — ABNORMAL LOW (ref 13.0–18.0)
MCHC: 33.8 g/dL (ref 32.0–36.0)
MCV: 91 fL (ref 80–100)
Monocyte #: 0.2 x10 3/mm (ref 0.2–1.0)
Monocyte %: 3.1 %
Neutrophil %: 92 %
Platelet: 210 10*3/uL (ref 150–440)
RBC: 4.19 10*6/uL — ABNORMAL LOW (ref 4.40–5.90)
WBC: 7.8 10*3/uL (ref 3.8–10.6)

## 2012-11-13 LAB — BASIC METABOLIC PANEL
Calcium, Total: 8.6 mg/dL (ref 8.5–10.1)
Chloride: 107 mmol/L (ref 98–107)
Co2: 31 mmol/L (ref 21–32)
Creatinine: 1.05 mg/dL (ref 0.60–1.30)
EGFR (African American): >60
Glucose: 134 mg/dL — ABNORMAL HIGH (ref 65–99)
Potassium: 3.8 mmol/L (ref 3.5–5.1)
Sodium: 144 mmol/L (ref 136–145)

## 2012-11-13 LAB — MAGNESIUM: Magnesium: 2 mg/dL

## 2012-11-14 LAB — CBC WITH DIFFERENTIAL/PLATELET
Basophil %: 0.4 %
Eosinophil #: 1.5 10*3/uL — ABNORMAL HIGH (ref 0.0–0.7)
Eosinophil %: 13.6 %
HGB: 11.2 g/dL — ABNORMAL LOW (ref 13.0–18.0)
MCH: 30.4 pg (ref 26.0–34.0)
MCHC: 33.8 g/dL (ref 32.0–36.0)
Monocyte %: 4.8 %
Neutrophil #: 8 10*3/uL — ABNORMAL HIGH (ref 1.4–6.5)
Platelet: 172 10*3/uL (ref 150–440)

## 2012-11-14 LAB — BASIC METABOLIC PANEL
Anion Gap: 6 — ABNORMAL LOW (ref 7–16)
BUN: 9 mg/dL (ref 7–18)
EGFR (African American): >60
EGFR (Non-African Amer.): >60
Potassium: 3.4 mmol/L — ABNORMAL LOW (ref 3.5–5.1)
Sodium: 145 mmol/L (ref 136–145)

## 2013-04-25 DEATH — deceased

## 2013-11-04 IMAGING — CT CT HEAD WITHOUT CONTRAST
1 series · 16 of 30 positions shown, 20 images · non-contrast
Comparison: none

REASON FOR EXAM: weak syncopy
COMMENTS:

PROCEDURE:     CT  - CT HEAD WITHOUT CONTRAST  - July 11, 2012  [DATE]
RESULT:     Technique: Helical noncontrasted 5 mm sections were obtained
from the skull base through the vertex.

[Series 3: bone · axial · 0.39mm/px · z∈[-112,+23]mm · 16 of 31 slices shown, 20 images]
[im 2/31  brain]
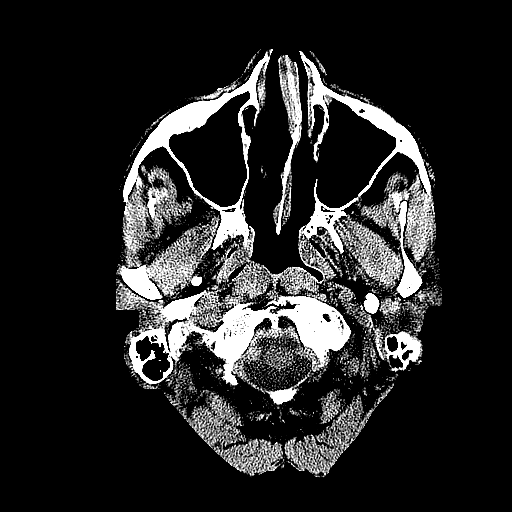
[im 2/31  bone]
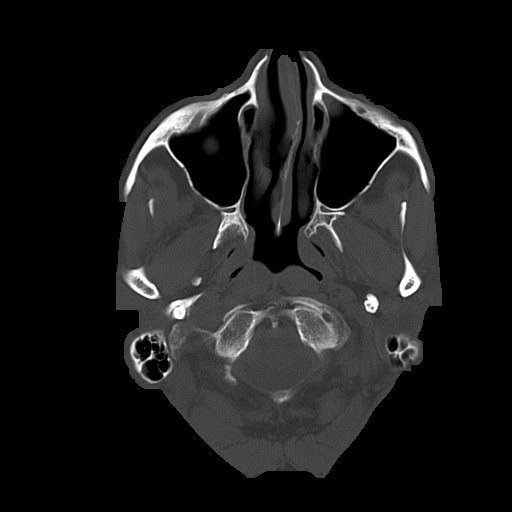
[im 4/31  brain]
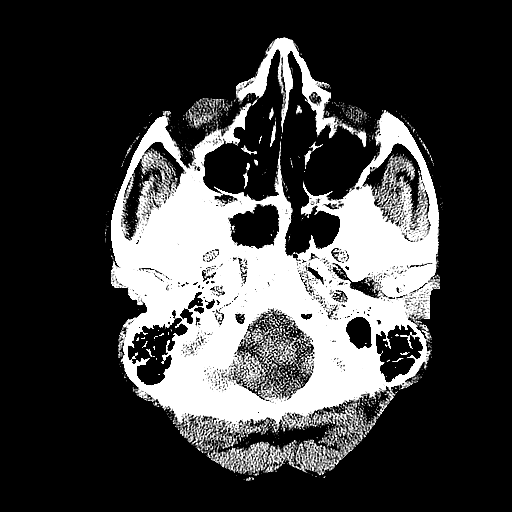
[im 6/31  brain]
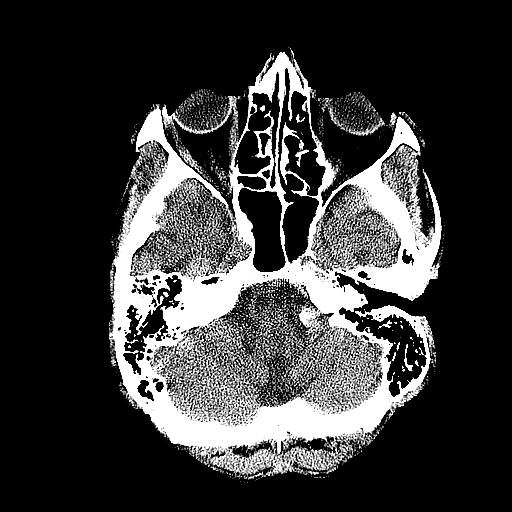
[im 8/31  brain]
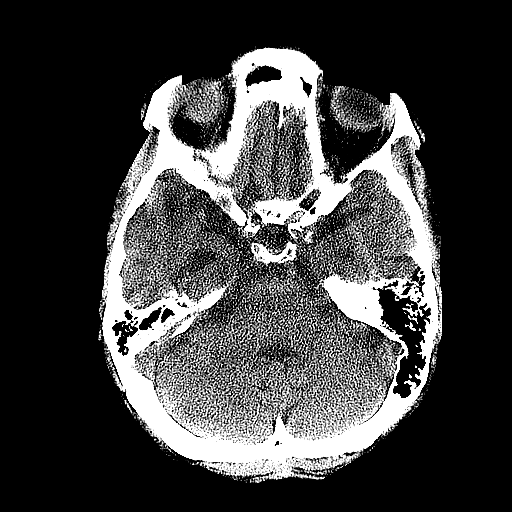
[im 9/31  brain]
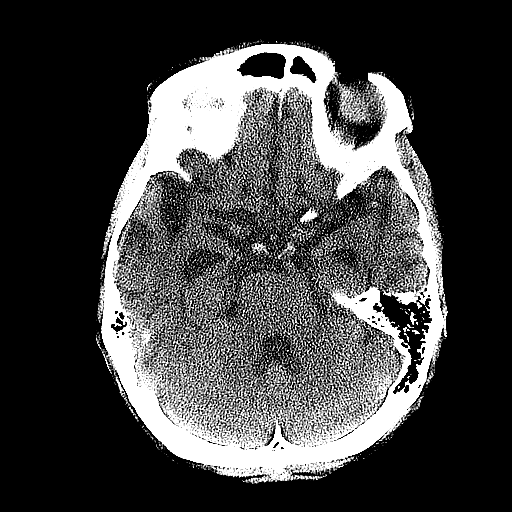
[im 9/31  bone]
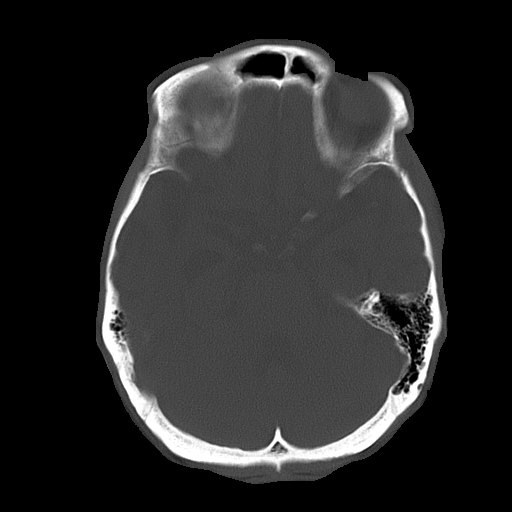
[im 11/31  brain]
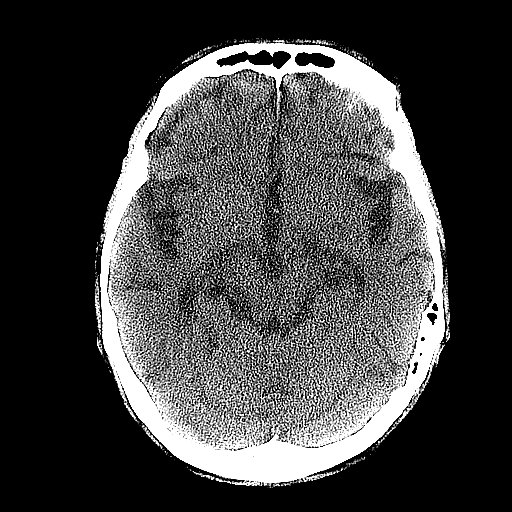
[im 13/31  brain]
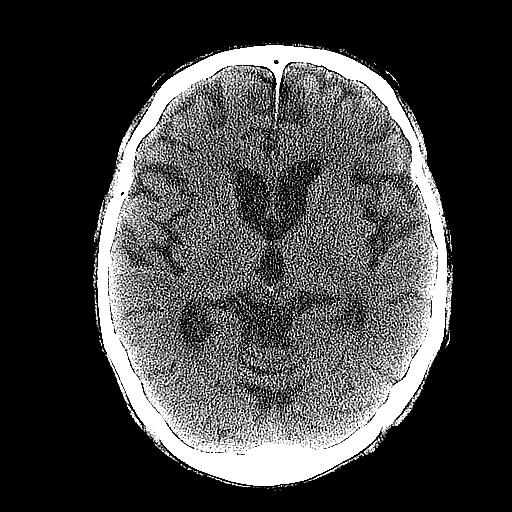
[im 15/31  brain]
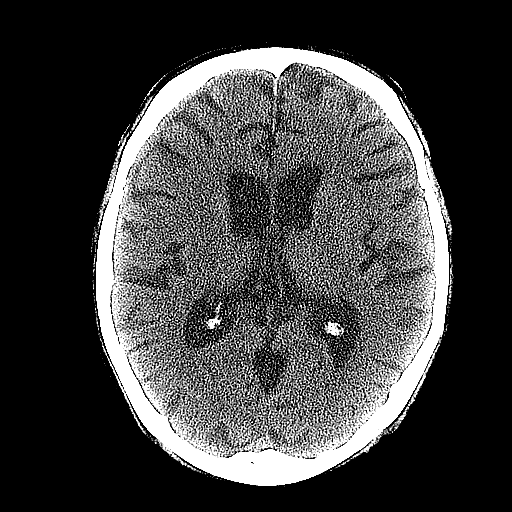
[im 16/31  brain]
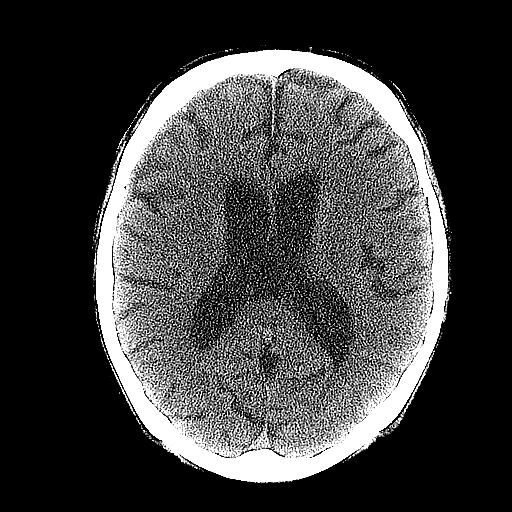
[im 16/31  bone]
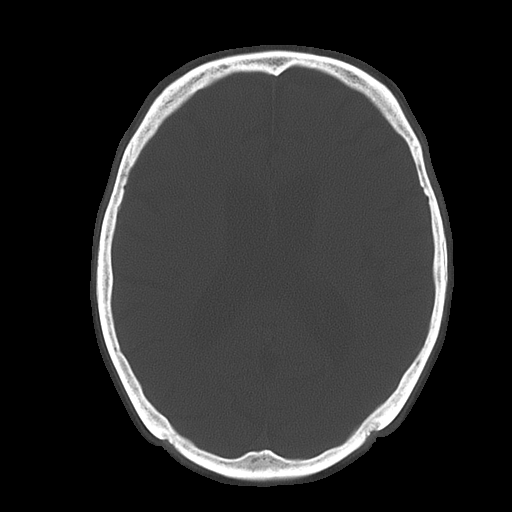
[im 18/31  brain]
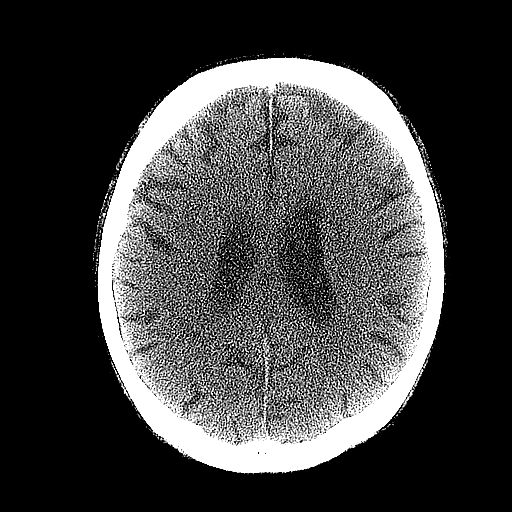
[im 20/31  brain]
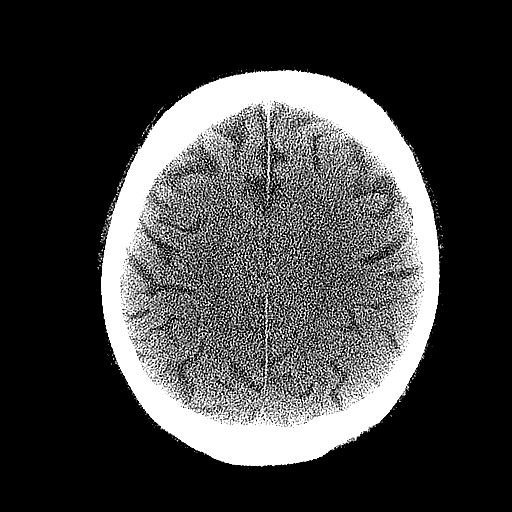
[im 22/31  brain]
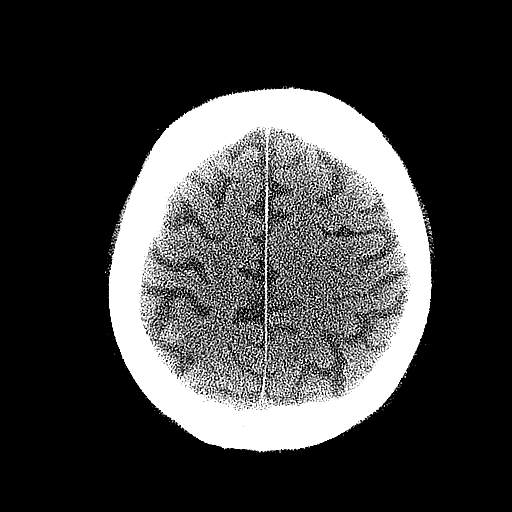
[im 23/31  brain]
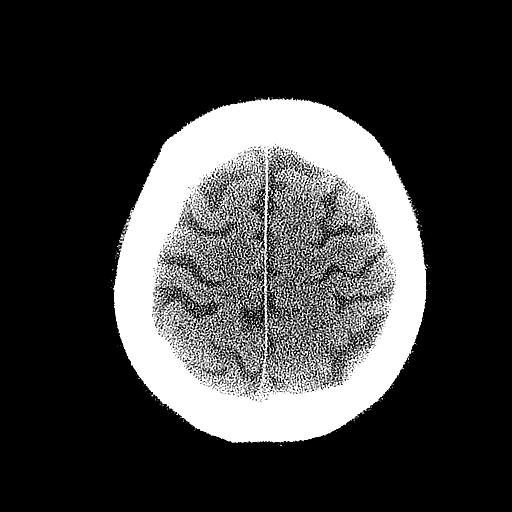
[im 23/31  bone]
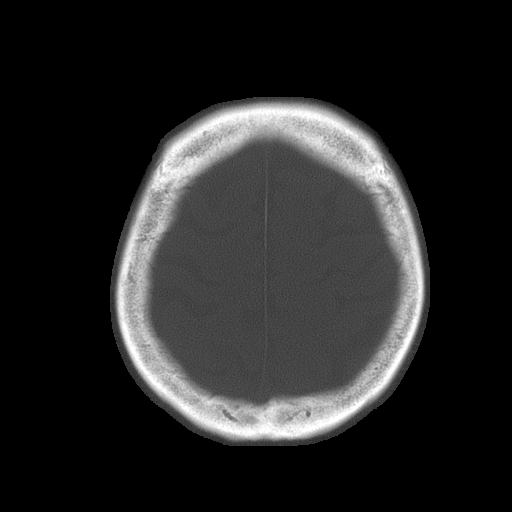
[im 25/31  brain]
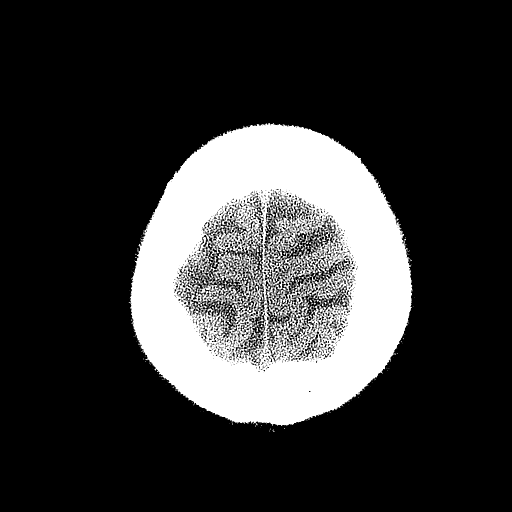
[im 27/31  brain]
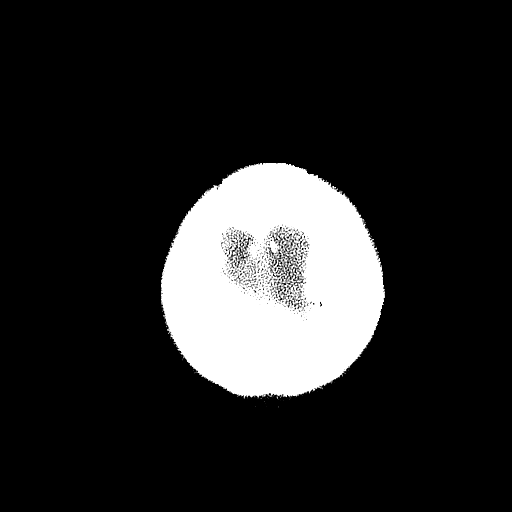
[im 29/31  brain]
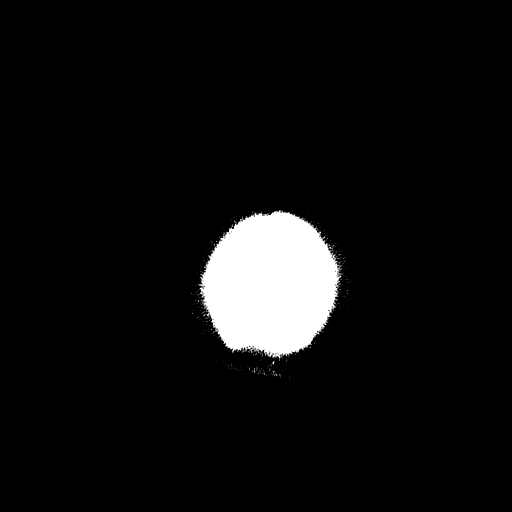

[16 of 30 positions shown; findings below may reference images not displayed]

FINDINGS: Diffuse cortical and cerebellar atrophy is identified as well as
diffuse areas of low attenuation within the subcortical, deep and
periventricular white matter regions. There is not evidence of intra-axial
nor extra-axial fluid collections, acute hemorrhage, mass effect, nor a
depressed skull fracture. The visualized paranasal sinuses and mastoid air
cells are patent.
IMPRESSION: Chronic and involutional changes without evidence of acute
abnormalities. If there is persistent clinical concern further evaluation
with MRI is recommended.

## 2013-11-18 IMAGING — CT CT HEAD W/O CM
1 of 2 series · 13 of 30 positions shown, 17 images · non-contrast
Comparison: None.

CLINICAL DATA: Syncope, slurred speech

CT HEAD WITHOUT CONTRAST
TECHNIQUE: Contiguous axial images were obtained from the base of
the skull through the vertex without contrast.

[Series 2: brain · axial · 0.47mm/px · z∈[+113,+242]mm · 13 of 28 slices shown, 17 images]
[im 2/28  brain]
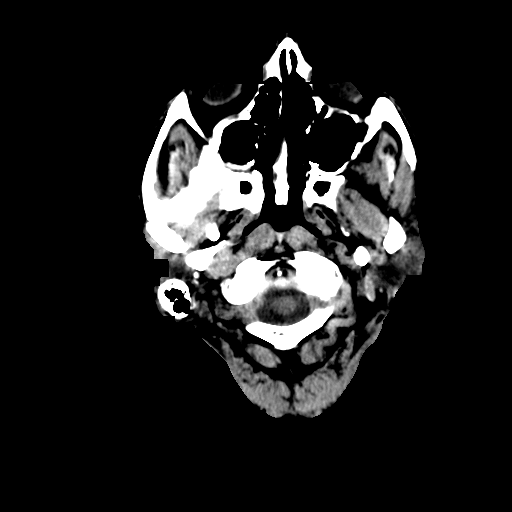
[im 2/28  bone]
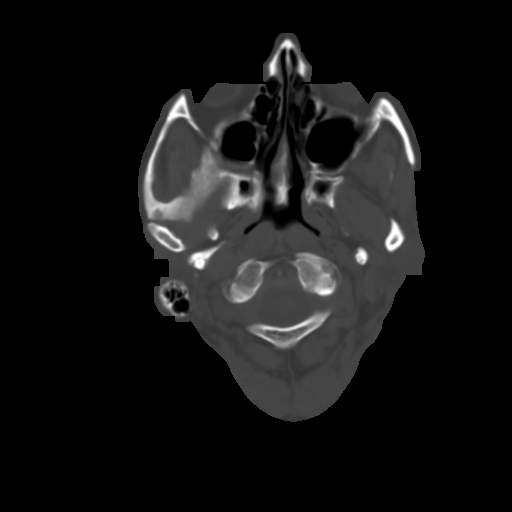
[im 4/28  brain]
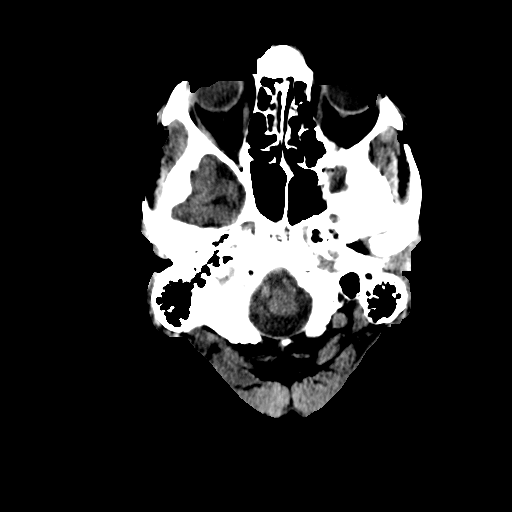
[im 6/28  brain]
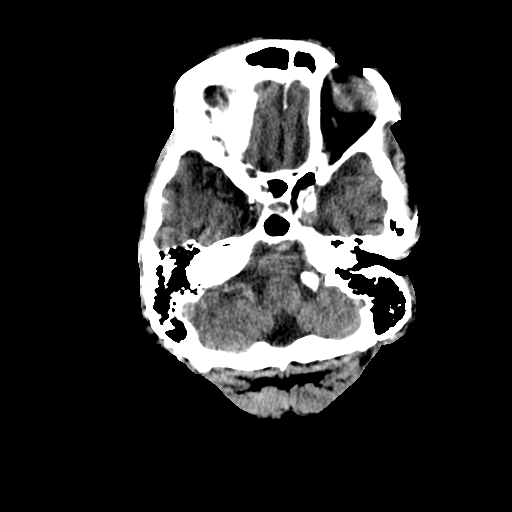
[im 8/28  brain]
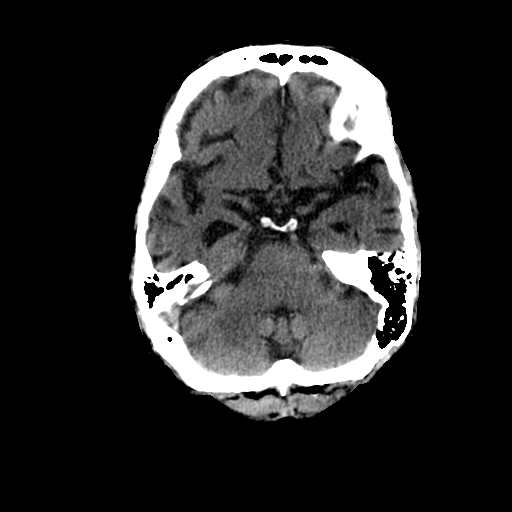
[im 10/28  brain]
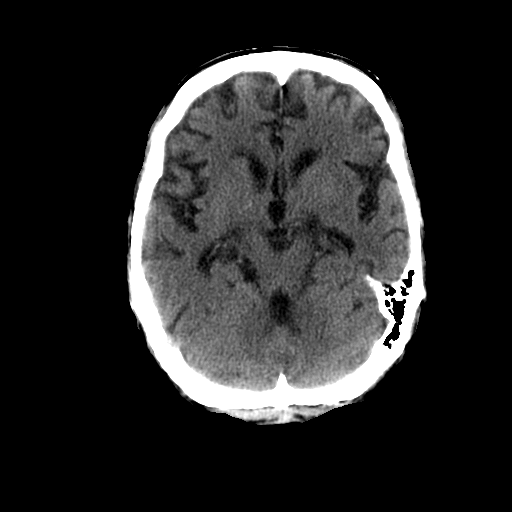
[im 10/28  bone]
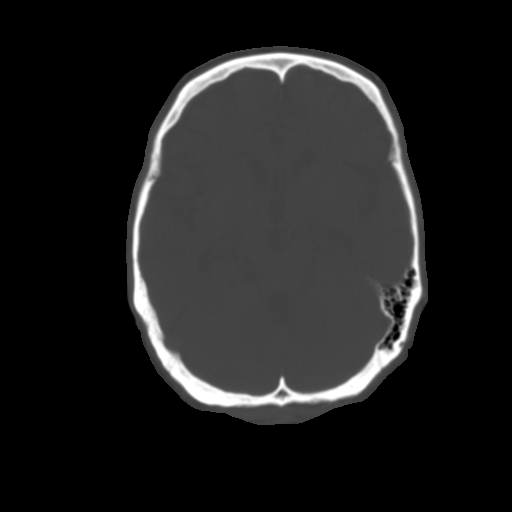
[im 12/28  brain]
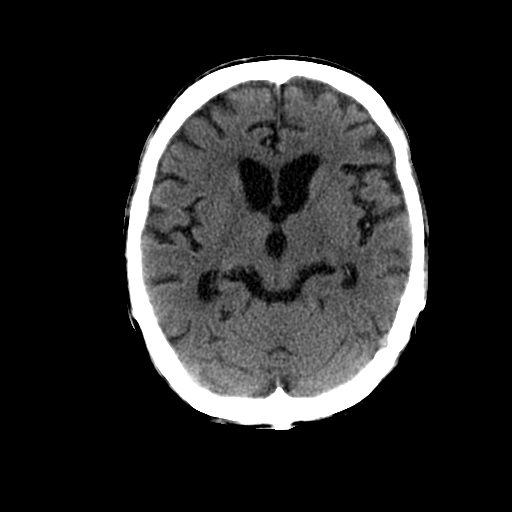
[im 14/28  brain]
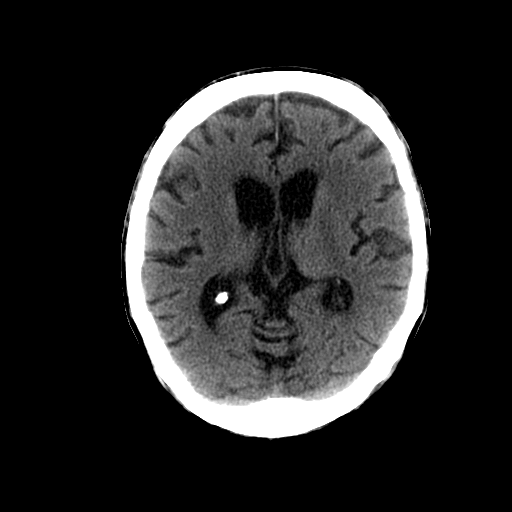
[im 16/28  brain]
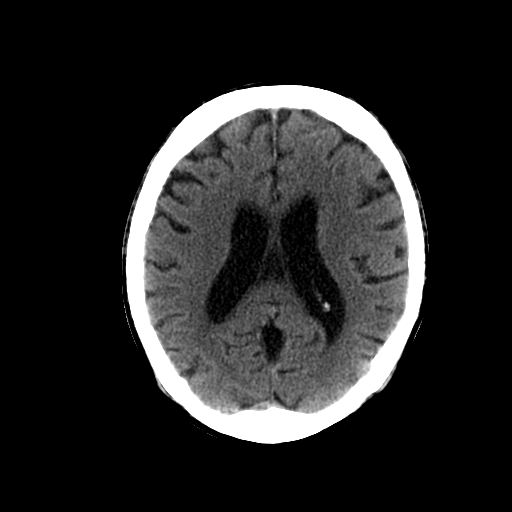
[im 18/28  brain]
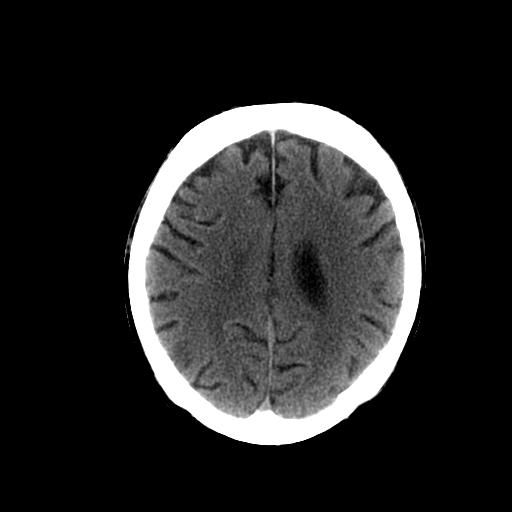
[im 18/28  bone]
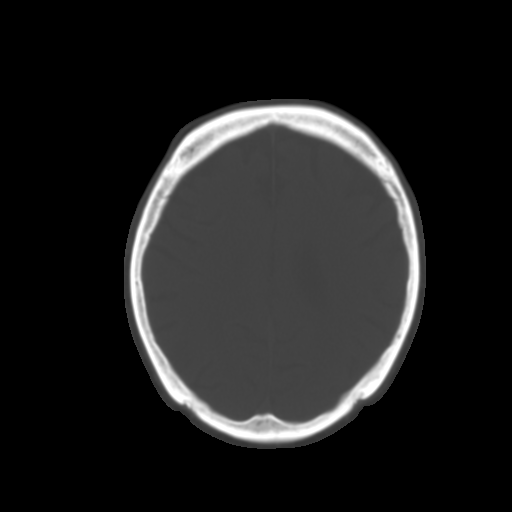
[im 20/28  brain]
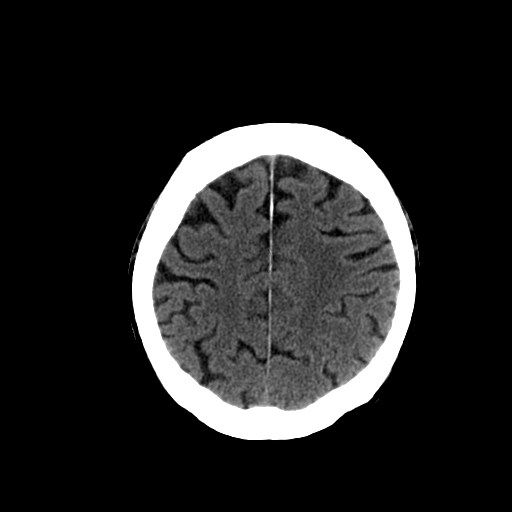
[im 22/28  brain]
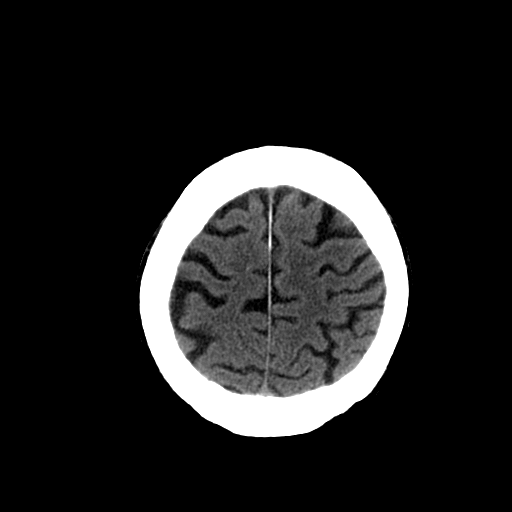
[im 24/28  brain]
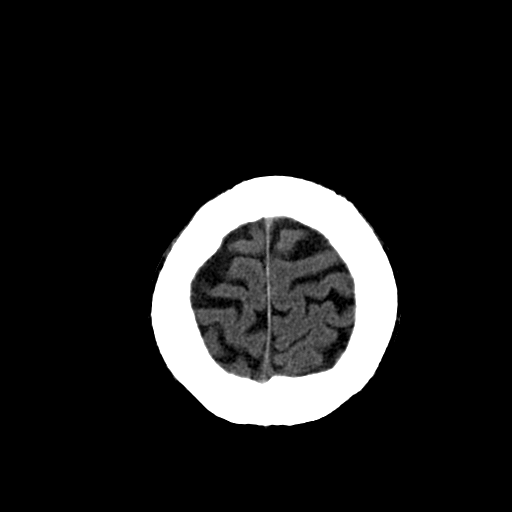
[im 26/28  brain]
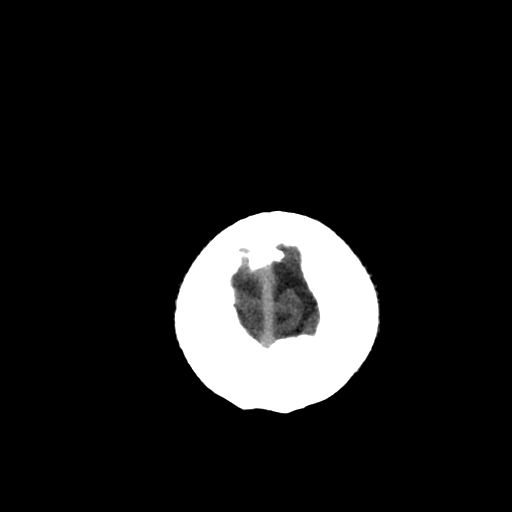
[im 26/28  bone]
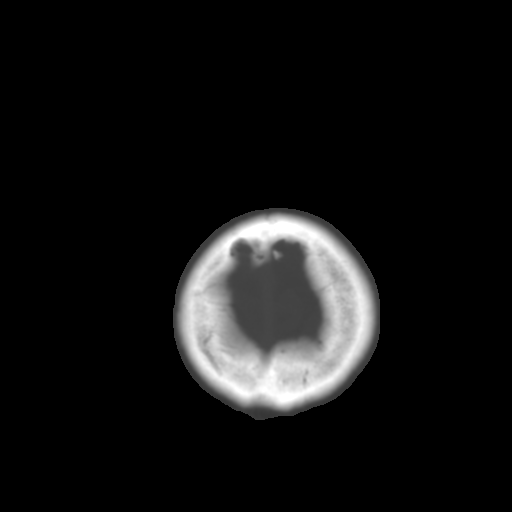

[13 of 30 positions shown; findings below may reference images not displayed]

FINDINGS: No acute intracranial hemorrhage.  No focal mass lesion.
No CT evidence of acute infarction.   No midline shift or mass
effect.  No hydrocephalus.  Basilar cisterns are patent.

There is generalized cortical atrophy.  There are periventricular
white matter hypodensities.  There is mild ventricular dilatation.

Paranasal sinuses and mastoid air cells are clear.  Orbits are
normal.
IMPRESSION: 1.  No acute intracranial findings.
2.  Atrophy and concordant mild ventricular dilatation
3.  Mild microvascular disease

## 2014-03-08 IMAGING — CR DG CHEST 1V PORT
1 series · 1 of 1 positions shown · non-contrast
Comparison: none

REASON FOR EXAM: Chest Pain
COMMENTS:

PROCEDURE:     DXR - DXR PORTABLE CHEST SINGLE VIEW  - November 12, 2012  [DATE]
RESULT:     Comparison is made to prior study dated 07/11/2012.

[ap]
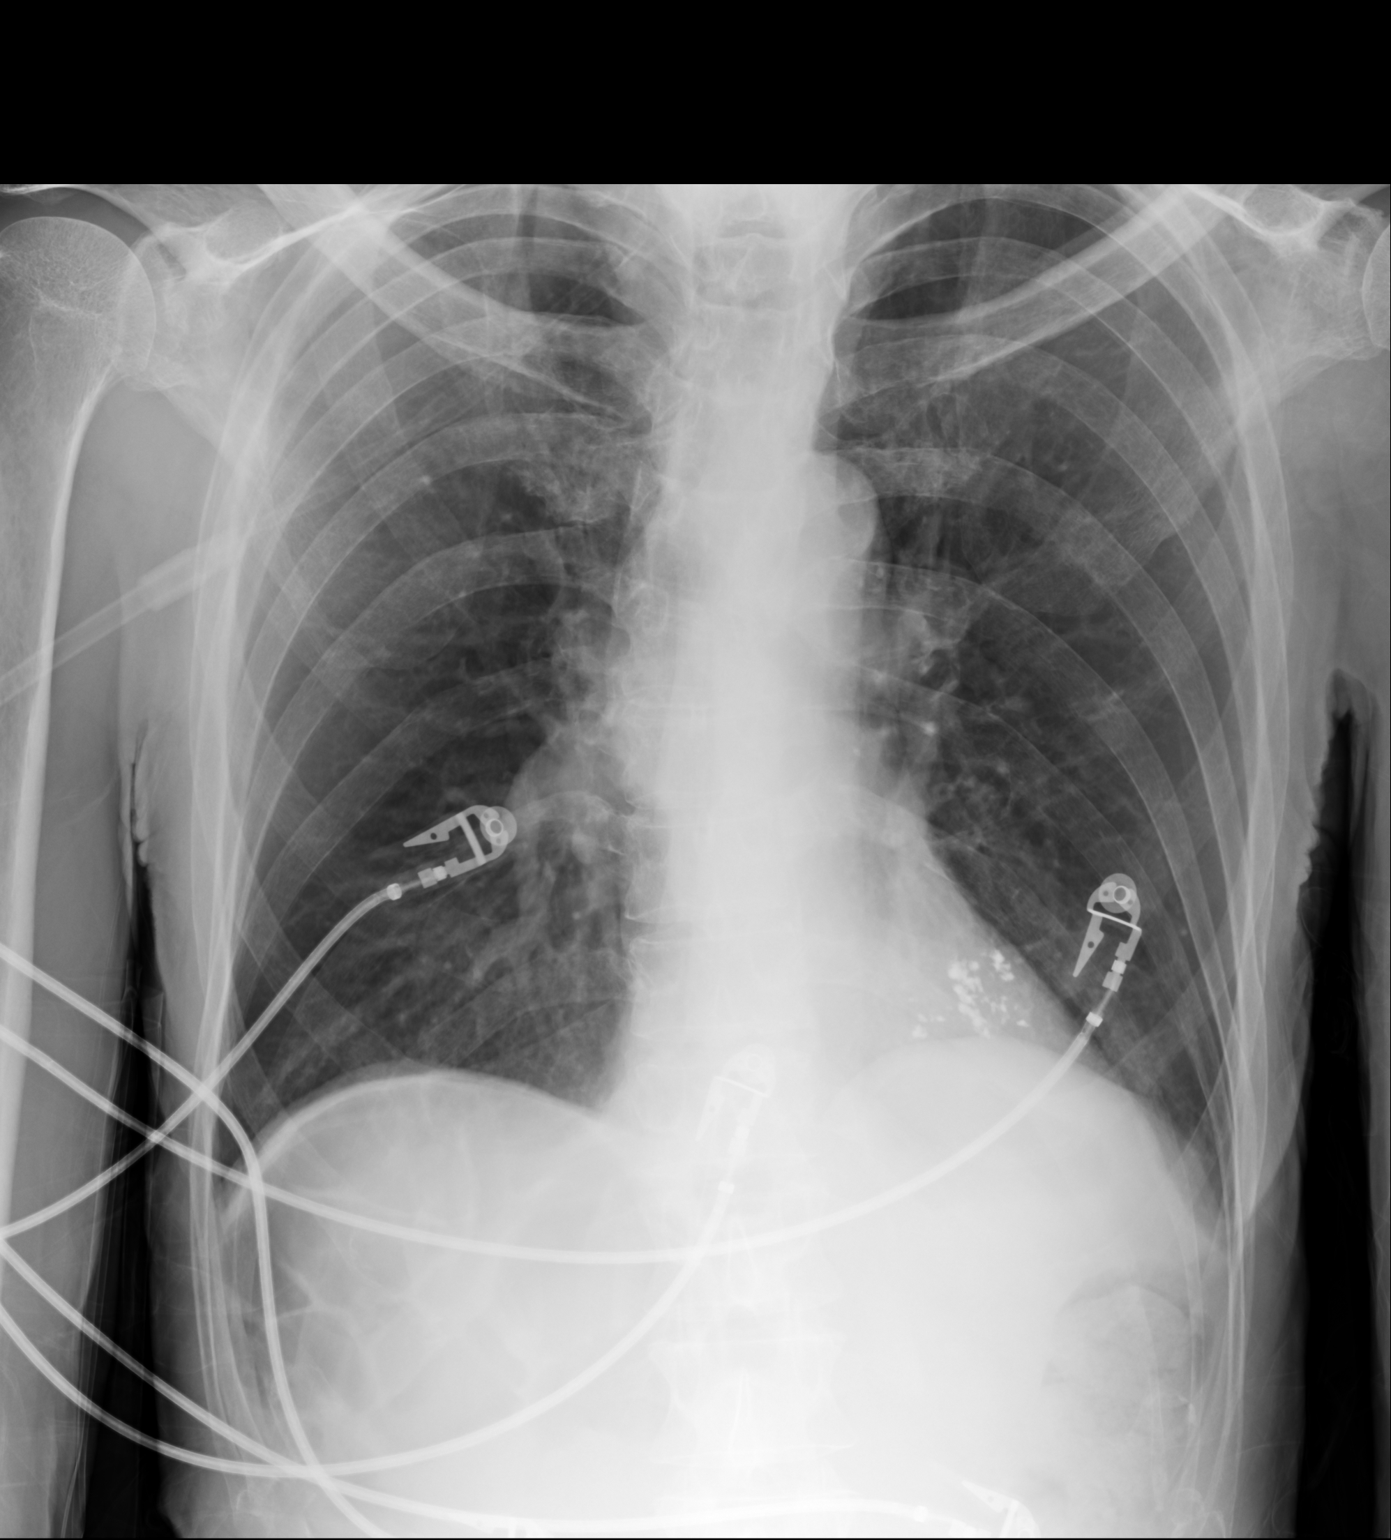

[1 of 1 positions shown; findings below may reference images not displayed]

FINDINGS: Punctate areas of high density project within the left lung base.
These may represent external artifact or possibly the sequela of etiology
such as prior barium swallow. This finding has developed in the interim. No
focal regions of consolidation or focal infiltrates are appreciated. The
cardiac silhouette and visualized bony skeleton are unremarkable.
IMPRESSION: 1. No evidence of acute cardiopulmonary disease. Findings within the left
lung base as described above.

## 2014-09-15 NOTE — Consult Note (Signed)
PATIENT NAME:  Joshua Farrell, Joshua Farrell MR#:  [id number] DATE OF BIRTH:  [birth year]  DATE OF CONSULTATION:  11/12/2012  REFERRING PHYSICIAN:   CONSULTING PHYSICIAN:  Christena DeemMartin U. Lurena Naeve, MD  PRIMARY CARE PHYSICIAN:  A patient of Dr. Janalyn Harderavid Kaminski and Dr. Annia [place]Beard in [place].   REASON FOR CONSULTATION:  Possible esophageal foreign body.   HISTORY OF PRESENT ILLNESS:  Joshua Farrell is a 74 year old African American male who was brought to the Emergency Room by his wife this evening with complaint of not being able handle his oral secretions.  Initial evaluation was done by the ER physician who then consulted ENT.  An ENT evaluation was done showing no overt obstructions in or around the airway itself.  It was obvious however that he was having a lot of copious secretions that he was not able to swallow.  In discussion with his wife, he was apparently eating at the house at approximately 8:00 p.m.  Then she found that he had left the dining room and had gone to the bathroom, was leaning over the toilet slobbering at that time.  He had apparently been eating some Au gratin  potatoes.  There was no other food on the plate.  The patient is able to give some answers, but he has apparently some advancing Parkinson's and he is unable to elucidate very well on history.  He does currently deny however any pain.  There is no abdominal pain.  He has been having some difficulty swallowing and has been evaluated at [facility] [place] in this regard.  His main difficulty of swallowing has been with liquids initially and after a barium test wife was advised to thicken foods and not use clear/watery foods.  The patient recently had his teeth removed.  He has a full plate lower and a partial upper.  The patient's wife states that all of these are intact.  She does not have those in the ER with her, but states that she did bring them in the car, but none of those parts missing.  He has had no problems with nausea or vomiting.  Apparently  the [facility] showed evidence of aspiration.  Wife states that he has not been regurgitating food and he has not been having to force himself to vomit to remove obstruction from the esophagus.  He has not had an EGD or luminal evaluation other than the swallowing study per patient's wife.   SOCIAL HISTORY:  He has no history of alcohol or tobacco use.  He has never had a colonoscopy.  He does have problems with constipation for which he takes an occasional stool softener.   PAST MEDICAL HISTORY:  The patient has a history of dementia as well as hypertension.  There is no history of surgery.   OUTPATIENT MEDICATIONS:  Amantadine 100 mg twice a day, amlodipine 5 mg once a day, Namenda XR 28 extended-release 1 capsule daily, primidone 50 mg 2 tablets once a day, risperidone 2 mg once a day, Rozerem 8 mg tablet once a day at bedtime and vitamin D3 2000 international units once a day.   ALLERGIES:  He has no known drug allergies.   PHYSICAL EXAMINATION: VITAL SIGNS:  Temperature is 97.4, pulse 102, respirations 26, blood pressure 141/72, pulse ox 92%.  GENERAL:  He is a 74 year old African American male.  He is sitting upright with a suction catheter in his mouth to remove secretions.  He is alert.  He will  attempt to answer questions in a simple fashion yes or no and at times vocalize.  HEENT:  Normocephalic, atraumatic.  EYES:  Anicteric.  NOSE:  Septum midline.  OROPHARYNX:  He has frontal teeth on the uppers, none on the lower, none on rear uppers bilateral.  NECK:  No JVD.  No lymphadenopathy.  HEART:  Regular rate and rhythm.  LUNGS:  Clear.  ABDOMEN:  Soft, nontender, nondistended.  Bowel sounds are positive, normoactive.  ANORECTAL:  Deferred.  EXTREMITIES:  No clubbing, cyanosis or edema.  NEUROLOGICAL:  Cranial nerves II through XII grossly intact, however the patient cannot seem to force a smile, nor can he puff cheeks.  He is able to open his mouth and  protrude his tongue without difficulty.  There does not seem to be in any other foreign body or affectation to the inside of the mouth.   LABORATORY, DIAGNOSTIC, AND RADIOLOGICAL DATA:  He has a glucose of 128, a BNP of 203, BUN 7, creatinine 0.92, sodium 142, potassium 3.7, chloride 108, bicarb 29, osmolality 283, calcium 8.9.  Hepatic profile was normal.  His troponin I x 1 was less than 0.02.  His hemogram shows a white count of 4.4, H and H is 13.3/39.5, platelet count of 231, MCV is 90.  There has been a portable chest film done that shows no acute changes.  There is some tortuosity to the esophagus noted.  There is no radiopaque object apparently in the esophagus.   ASSESSMENT:  Possible esophageal food impaction.   RECOMMENDATIONS:  EGD with foreign body removal.  I have discussed the risks, benefits, and complications of this procedure to include, but not limited to bleeding, infection, perforation, and the risk of sedation to patient and his wife and they wish to proceed.  I have called nursing supervisor and we will arrange to have anesthesia assistance for this procedure.      ____________________________ Christena Deem, MD mus:ea D: 11/12/2012 23:04:20 ET T: 11/13/2012 00:03:51 ET JOB#: [id number]  cc: Christena Deem, MD, <Dictator> Christena Deem MD ELECTRONICALLY SIGNED 12/20/2012 7:10

## 2014-09-15 NOTE — Consult Note (Signed)
Chief Complaint:  Subjective/Chief Complaint Please see EGD report.  Food impaction removed form the distal esophagus.  Patient experianced about 7 minutes of low blood pressure after induction, with the lowest pressure about 45 systolic. Good recovery without tachycardia.  Will admit patient for observation, probable d/c tomorrow pending labs. Discussed with [facility], appreciate assistance.   Electronic Signatures: Barnetta ChapelSkulskie, Martin (MD)  (Signed 21-Jun-14 01:36)  Authored: Chief Complaint   Last Updated: 21-Jun-14 01:36 by Barnetta ChapelSkulskie, Martin (MD)

## 2014-09-15 NOTE — Discharge Summary (Signed)
PATIENT NAME:  Joshua Farrell, Alfonza MR#:  [id number] DATE OF BIRTH:  04-14-1941  DATE OF ADMISSION:  11/13/2012 DATE OF DISCHARGE:  11/14/2012  ADMITTING PHYSICIAN: Huey Bienenstockawood Elgergawy, MD  DISCHARGING PHYSICIAN:  Enid Baasadhika Rhodes Calvert, MD  CONSULTANTS:  In the hospital:  GI by Dr. Marva PandaSkulskie.  PRIMARY CARE PHYSICIAN:  In [place].  DISCHARGE DIAGNOSES: 1.  Dysphagia due to food impaction in the distal esophagus, removed with the help of EGD.   2.  Parkinson's disease.  3.  Dementia.  4.  Hypertension.  5.  Distal esophagitis.   DISCHARGE MEDICATIONS: 1.  Amlodipine 5 mg p.o. daily.  2.  Amantadine 100 mg p.o. b.i.d.  3.  Risperidone 2 mg p.o. daily.  4.  Namenda SR 28 mg capsule p.o. daily.  5.  Rozerem 8 mg at bedtime.  6.  Primidone 100 mg p.o. daily.  7.  Vitamin D3 2000 international units p.o. daily.  8.  Protonix 40 mg p.o. b.i.d.   DISCHARGE DIET:  Pureed with nectar thick liquids and aspiration precautions to be followed while eating.  DISCHARGE ACTIVITY: As tolerated.   FOLLOWUP INSTRUCTIONS: 1.  PCP follow-up in 1 to 2 weeks.  2.  Resume home hospice services.  3.  Outpatient speech therapy followup for modified barium swallow study.   LABORATORY AND DIAGNOSTICS:  Prior to discharge: WBC 11.4, hemoglobin 11.2, hematocrit 33.2, platelet count 172.   Sodium 145, potassium 3.4, chloride 109, bicarb 30, BUN 9, creatinine 0.83, glucose 77 and calcium of 8.5. Magnesium 2.0.  Chest x-ray on admission showing no evidence of any cardiopulmonary disease. Punctate areas of high density in the left lung base as a result of prior barium swallow, possibly.  Upper GI endoscopy done from ED on 11/12/2012, in the evening, showing severe esophagitis in the lower third of esophagus and food was present and it removed successfully. Normal stomach.   BRIEF HOSPITAL COURSE: Mr. Joshua Farrell is a 74 year old male with past medical history significant for hypertension, dementia and Parkinson's disease  with recent progression who is bedbound at baseline and followed by home hospice services was brought in secondary to dysphagia and choking after eating his food. He was seen by ENT while in the ED and direct laryngoscopy was done and there was no food lodged in the windpipe.  He was also seen by GI for the same reason because of ongoing drooling and dysphagia and inability to swallow and had an upper GI endoscopy done on 11/12/2012. Was found to have a big piece of potato in the lower third of the esophagus that was successfully removed and was found to have severe esophagitis. The patient became hypotensive during the procedure and was admitted under medical service for observation.  1.  For his severe esophagitis, he was started on Protonix while in the hospital and will have outpatient GI follow-up. 2.  Dysphagia, likely secondary worsening by his Parkinson's disease because there was no stricture, mass or external obstruction seen during the upper GI endoscopy. His was seen by speech pathology while in the hospital who recommended puree diet with nectar thick liquids and outpatient barium swallow.  3.  Hypotension, likely secondary to the sedative medications given during the upper GI endoscopy procedure. His blood pressure has been stable while in the hospital and his Norvasc is being restarted at the time of discharge.  4.  Dementia and Parkinson's disease. His home medications were continued.   His course has been otherwise uneventful in the hospital.   DISCHARGE CONDITION:  Stable.   DISCHARGE DISPOSITION: Home with hospice services.   TIME SPENT ON DISCHARGE: 40 minutes.  ____________________________ Enid Baas, MD rk:sb D: 11/15/2012 12:52:12 ET T: 11/15/2012 13:28:29 ET JOB#: [id number]  cc: Enid Baas, MD, <Dictator> Enid Baas MD ELECTRONICALLY SIGNED 11/15/2012 20:41

## 2014-09-15 NOTE — Consult Note (Signed)
PATIENT NAME:  Renaee MundaODOM, Kongmeng MR#:  [id number] DATE OF BIRTH:  [birth year]  DATE OF CONSULTATION:  11/12/2012  REFERRING PHYSICIAN:  Janalyn Harderavid Kaminski.  CONSULTING PHYSICIAN:  Ollen Grossaul S. Willeen CassBennett, MD  REASON FOR CONSULTATION: Difficulty swallowing.   HISTORY OF PRESENT ILLNESS: This 74 year old male presented to the Emergency Room for evaluation of difficulty swallowing after possibly choking on food. He was eating soft foods such as potatoes when his wife noted that he looked like he was having trouble choking and went to the bathroom to try to throw up. Since then, he has been unable to swallow his own secretions and spitting out frothy secretions. He has a normal chest x-ray. He does have a lower denture, but the wife has possession of that, so he did not swallow the denture. There were no meats involved. The patient is demented, so he is not able to give much personal history. His mental status is not changed according to his wife other than for difficulty swallowing. I asked the patient if he is having any pain in his chest and got a potential indication of yes, but this was quite unclear and the wife could not tell if that is what he was saying. Reviewing the patient's medical record, he did have a previous episode of getting some [place] lodged in his throat back in 2012. Apparently, this episode was resolved as he was given initially glucagon and then nitroglycerin which allowed him to then swallow and resolve the situation. We do not have any record that GI endoscopy was performed at the time.   PAST MEDICAL HISTORY: Dementia, hypertension.   MEDICATIONS: Vitamin D2, risperidone 2 mg p.o. daily, donepezil 10 mg p.o. daily, amlodipine 5 mg p.o. daily, amantadine 100 mg p.o. b.i.d.   ALLERGIES: None.   SOCIAL HISTORY: He is here with his wife.   REVIEW OF SYSTEMS: Unobtainable due to his dementia and inability to communicate.   PHYSICAL EXAMINATION:  VITAL SIGNS: Temperature 97.4, pulse 104,  respirations 24 with 88% O2 sat, blood pressure 129/81.  GENERAL: Thin elderly male obviously in discomfort with gurgling secretions in his throat. HEAD AND FACE: Head is normocephalic, atraumatic. There are no facial skin lesions.  EARS: External ears unremarkable. Ear canals reveal cerumen bilaterally. I could not visualize tympanic membranes.  NOSE: External nose unremarkable. The nasal cavity is clear. No purulence is seen. The septum is relatively straight.  ORAL CAVITY AND OROPHARYNX: Teeth are unremarkable. He is edentulous in the lower dentition. Tongue and floor of mouth are unremarkable. Posterior pharynx is clear except for frothy salivary secretions. There is no foreign body visualized, however. There is no evidence of erythema, exudate or swelling.  NECK: Supple without adenopathy or mass. There is no thyromegaly. Salivary glands are soft and nontender.  NEUROLOGIC: Grossly intact.   PROCEDURE NOTE:   PREOPERATIVE DIAGNOSIS: Dysphagia with possible foreign body.   POSTOPERATIVE DIAGNOSIS: Dysphagia with possible foreign body.   PROCEDURE: Flexible fiberoptic nasal laryngoscopy.   SURGEON: Anthoney HaradaPaul Scott Sincere Berlanga, M.D.   DESCRIPTION OF PROCEDURE: After discussing the procedure with the patient and his family, a flexible fiberoptic scope was passed through the right nasal cavity. The nasopharynx and nasal cavity were found to be clear. The hypopharynx, larynx and tongue base were inspected. He had pooling of salivary secretions, which were very frothy, in the piriform sinus, but there was no evidence of any foreign body. I removed the scope and had him cough. and I suctioned the secretions with the Yankauer to  remove them for better visualization. The scope was then reintroduced and once again assessed the region. Vocal cords were clear and mobile. There was no evidence of any secretions coming up from the trachea. This is basically pooling of frothy salivary secretions in the piriform sinus,  but no foreign body seen and I was able to see the piriform sinus completely. The scope was withdrawn. The patient tolerated the procedure well.   ASSESSMENT: This patient would appear to most likely have an esophageal foreign body, although it is surprising this would occur with the type of food he was eating. Questioned his family, and they are in possession of his lower denture. He has a prior history of having food impacted in his esophagus from 2 years ago.   PLAN: I recommended further evaluation by gastroenterology, with consideration of upper GI endoscopy to evaluate for any type of foreign body in the esophagus as this appears to be the source of the extremis.    ____________________________ Ollen Gross. Willeen Cass, MD psb:gb D: 11/12/2012 21:56:45 ET T: 11/12/2012 22:19:00 ET JOB#: [id number]  cc: Ollen Gross. Willeen Cass, MD, <Dictator> Sandi Mealy MD ELECTRONICALLY SIGNED 11/16/2012 8:28

## 2014-09-15 NOTE — Consult Note (Signed)
Brief Consult Note: Diagnosis: possible esophageal obstruction/foriegn body.   Patient was seen by consultant.   Consult note dictated.   Recommend to proceed with surgery or procedure.   Orders entered.   Discussed with Attending MD.   Comments: Please see full GI consult.  Patient presenting to the ER with difficulty handling secretions.  Patient was eating a meal this evening, followed by development of symptom,s.  Possible food impaction with Potato?  Will arrange for EGD with anesthesia assistance.  I have discussed the risks benefits and complications of egd to include not limited to bleeding infection perforation and sedation and he and wife wish to proceed.  Electronic Signatures: Barnetta ChapelSkulskie, Mykal Batiz (MD)  (Signed 20-Jun-14 23:07)  Authored: Brief Consult Note   Last Updated: 20-Jun-14 23:07 by Barnetta ChapelSkulskie, Alaia Lordi (MD)

## 2014-09-15 NOTE — H&P (Signed)
PATIENT NAME:  Joshua, Farrell MR#:  [id number] DATE OF BIRTH:  1940/07/28  DATE OF ADMISSION:  11/13/2012  REFERRING PHYSICIAN:  Dr. Barnetta Chapel.   PRIMARY CARE PHYSICIAN:  Dr. Annia Friendly in [place].   CHIEF COMPLAINT:  Food impaction.   HISTORY OF PRESENT ILLNESS:  [name] is a 74 year old African American male with significant past medical history of dementia, psychiatric history including bipolar disorder and schizophrenia, hypertension, who baseline able to ambulate with a walker and assistance and communicative, while patient was brought by his wife due to episode of choking on food while was eating. The patient's wife at bedside and she gives most of the history, as patient is  extremely poor historian and minimally communicative. The wife reports he was eating potato while he had episode of choking where in the ED. The patient was seen initially by EMT who had flexible fibroscopy evaluation for his larynx where he was not seen to have any foreign body, so then Dr. Marva Panda from GI service evaluated the patient where he had the EGD for the patient, where patient was found to have food in the lower third of the esophagus where it was removed, which was consistent with potatoes, and he was found to have mildly severe esophagitis with no bleeding. Evidence in the stomach was normal. During the EGD, the patient had a drop in his blood pressure, so patient will be admitted for observation for that. Wife reports husband at baseline. He has history of dysphagia, where he is on thickened diet, he reports _____ on a clear liquid diet he had choking and coughing episodes. Since then, he has been on thickened liquid. Reports he currently has an appointment scheduled for next week as he is supposed to be evaluated by Neurology service in [facility] for the [facility]. The patient currently lives with his daughter in [place] who helps take care of him where he has been seen by [facility]. The  patient was seen in the recovery room where his blood pressure has been stable and initially he was hypoxic after the procedure, but currently he is saturating well on 2 liters nasal cannula.   PAST MEDICAL HISTORY: 1.  Hypertension.  2.  Dementia.   PSYCHIATRIC HISTORY INCLUDING:  Bipolar disorder and schizophrenia versus schizoaffective disorder. Wife is unclear about that.   PAST SURGICAL HISTORY:  None.   HOME MEDICATIONS: 1.  Vitamin D 2000 units daily.  2.  Risperidone 2 mg p.o. daily.  3.  Donepezil 10 mg p.o. daily.  4.  Norvasc 5 mg daily.  5.  Amantadine 100 mg p.o. b.i.d.   6.  Namenda extended-release 28 mg oral daily.  7.  Primidone 50 mg oral 2 tablets daily.  8.  Rozerem 8 mg at bedtime.   ALLERGIES:  No known drug allergies.   SOCIAL HISTORY:  No history of tobacco or alcohol abuse, the patient currently lives with his daughter in [place]. Wife still lives in [place]. The patient has been seen by [facility].   FAMILY HISTORY:  Wife denies any history of cancer or diabetes or high blood pressure in the family.   REVIEW OF SYSTEMS:  Cannot be obtained secondary to patient's dementia.   PHYSICAL EXAMINATION: VITAL SIGNS:  Pulse 95, respiratory rate 24, blood pressure 148/85, pulse ox 95% at 2 liters nasal cannula, temperature 95.  GENERAL:  Thin, elderly male looks comfortable, in no apparent distress.  HEENT:  Head atraumatic, normocephalic. Pupils equal and reactive to light. Pink  conjunctivae. Anicteric sclerae. Moist oral mucosa.  NECK:  Supple. No thyromegaly. No JVD.  CHEST:  Good air entry bilaterally. No wheezing, rales, rhonchi.  CARDIOVASCULAR:  S1, S2 heard. No rubs, murmurs or gallops.  ABDOMEN:  Soft, nontender, nondistended. Bowel sounds present.  EXTREMITY:  No edema. No clubbing. No cyanosis. There is dorsalis pedis pulse felt bilaterally.  SKIN:  Normal skin turgor. Warm and dry. No rash.  PSYCHIATRIC:  The patient is awake, but not  communicative. Occasionally answer yes or no questions, but inappropriately, but unable even to verbalize or answer to his name.  NEUROLOGIC:  Unable to evaluate appropriately secondary to the patient's mental status, but appears to be moving all of his extremities without significant deficits. There is no muscle or rigidity and no hyperreflexia could be appreciated.   PERTINENT LABORATORIES:  Glucose 128, BNP 203, BUN 7, creatinine 0.92. Sodium 142, potassium 3.7, chloride 108, CO2 29, total protein 7.8. Total bili 0.5, alkaline phosphatase 74, AST 18, ALT 20, troponin less than 0.02. White blood cell 4.4, hemoglobin 13.3, hematocrit 39.5, platelet 231.   CHEST X-RAY:  Does not appear to have any volume overload or infiltrate. Still awaiting official radiologist's reading.   ASSESSMENT AND PLAN: 1.  Dysphagia with esophageal food impaction. The patient is status post esophagogastroduodenoscopy where he had food particularly removed. The patient is already on thickened liquid diet at home, but appears to be on regular diet, not pureed or mechanical soft. So, will have swallow service evaluate the patient in a.m. to see what kind of recommendation we should resume the patient's food on, but very likely he will need pureed diet with thick liquids or mechanical soft with thick liquid, as most likely his dementia is contributing to this.  2.  Esophagitis. We will have patient on IV Protonix as per GI recommendation.  3.  Hypotension. The patient had 1 episode during the procedure. This is most likely due to minutes during the procedure. Currently blood pressure is stable. Will monitor closely.  4.  Hypertension. Will hold meds and will resume. Discharge if stable.  5.  Psychiatric history. The patient does _____ have a history of bipolar disorder with schizophrenia. He will resume his home medications.  6.  History of dementia:  Continue home medications, including Namenda.  7.  Deep vein thrombosis  prophylaxis. Subcutaneous heparin.            8.  CODE STATUS. Wife reports patient is full code. Reports he has a LIVING WILL at home and requested the patient to bring him in a.m.   TOTAL TIME SPENT ON ADMISSION AND PATIENT CARE:  Fifty-five minutes.    ____________________________ Starleen Armsawood S. Kashawna Manzer, MD dse:nts D: 11/13/2012 03:24:37 ET T: 11/13/2012 05:26:38 ET JOB#: [id number]  cc: Starleen Armsawood S. Saroya Riccobono, MD, <Dictator> Gitty Osterlund Teena IraniS Necha Harries MD ELECTRONICALLY SIGNED 11/14/2012 6:14

## 2014-09-15 NOTE — Consult Note (Signed)
Chief Complaint:  Subjective/Chief Complaint doing well this am, handling secretions, denies nausea or abd pain, no odynophagia.   VITAL SIGNS/ANCILLARY NOTES: **Vital Signs.:   21-Jun-14 13:40  Vital Signs Type Routine  Temperature Temperature (F) 98.3  Celsius 36.8  Temperature Source oral  Pulse Pulse 85  Respirations Respirations 18  Systolic BP Systolic BP 407  Diastolic BP (mmHg) Diastolic BP (mmHg) 69  Mean BP 85  Pulse Ox % Pulse Ox % 98  Pulse Ox Activity Level  At rest  Oxygen Delivery 2L   Brief Assessment:  Cardiac Irregular   Respiratory clear BS   Gastrointestinal details normal Soft  Nontender  Nondistended  Bowel sounds normal   EXTR negative edema   Lab Results: Routine Chem:  21-Jun-14 04:27   Magnesium, Serum 2.0 (1.8-2.4 THERAPEUTIC RANGE: 4-7 mg/dL TOXIC: > 10 mg/dL  -----------------------)  Phosphorus, Serum 3.7 (Result(s) reported on 13 Nov 2012 at 05:00AM.)  Glucose, Serum  134  BUN 7  Creatinine (comp) 1.05  Sodium, Serum 144  Potassium, Serum 3.8  Chloride, Serum 107  CO2, Serum 31  Calcium (Total), Serum 8.6  Anion Gap  6  Osmolality (calc) 287  eGFR (African American) >60  eGFR (Non-African American) >60 (eGFR values <66m/min/1.73 m2 may be an indication of chronic kidney disease (CKD). Calculated eGFR is useful in patients with stable renal function. The eGFR calculation will not be reliable in acutely ill patients when serum creatinine is changing rapidly. It is not useful in  patients on dialysis. The eGFR calculation may not be applicable to patients at the low and high extremes of body sizes, pregnant women, and vegetarians.)  Cardiac:  21-Jun-14 04:27   CK, Total 46 (Result(s) reported on 13 Nov 2012 at 05:06AM.)  Routine Hem:  21-Jun-14 04:27   WBC (CBC) 7.8  RBC (CBC)  4.19  Hemoglobin (CBC)  12.9  Hematocrit (CBC)  38.1  Platelet Count (CBC) 210  MCV 91  MCH 30.7  MCHC 33.8  RDW  15.1  Neutrophil % 92.0   Lymphocyte % 4.3  Monocyte % 3.1  Eosinophil % 0.2  Basophil % 0.4  Neutrophil #  7.2  Lymphocyte #  0.3  Monocyte # 0.2  Eosinophil # 0.0  Basophil # 0.0 (Result(s) reported on 13 Nov 2012 at 04:56AM.)   Assessment/Plan:  Assessment/Plan:  Assessment 1) esophageal foriegn body/food impactions.  s/p egd with removal of blockage.  doing well.  episode of hypotension during proceedure, no overt problem today.   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  speech path eval pending.  Patient wife relates more rapid decline of swallowing and other sx related to this issue.   Plan 1) continue ppi.  awaiting result of SLP eval.   Electronic Signatures: SLoistine Simas(MD)  (Signed 21-Jun-14 15:26)  Authored: Chief Complaint, VITAL SIGNS/ANCILLARY NOTES, Brief Assessment, Lab Results, Assessment/Plan   Last Updated: 21-Jun-14 15:26 by SLoistine Simas(MD)
